# Patient Record
Sex: Female | Born: 1945 | Race: White | Hispanic: No | Marital: Married | Smoking: Former smoker
Health system: Southern US, Community
[De-identification: ages and names within clinical notes are randomized; demographics above are authoritative.]

## PROBLEM LIST (undated history)

## (undated) ENCOUNTER — Emergency Department (HOSPITAL_COMMUNITY): Admission: EM | Payer: Medicare HMO | Source: Home / Self Care

## (undated) DIAGNOSIS — E785 Hyperlipidemia, unspecified: Secondary | ICD-10-CM

## (undated) DIAGNOSIS — Z8679 Personal history of other diseases of the circulatory system: Secondary | ICD-10-CM

## (undated) DIAGNOSIS — I10 Essential (primary) hypertension: Secondary | ICD-10-CM

## (undated) DIAGNOSIS — E079 Disorder of thyroid, unspecified: Secondary | ICD-10-CM

## (undated) DIAGNOSIS — H269 Unspecified cataract: Secondary | ICD-10-CM

## (undated) DIAGNOSIS — C4490 Unspecified malignant neoplasm of skin, unspecified: Secondary | ICD-10-CM

## (undated) HISTORY — DX: Unspecified cataract: H26.9

## (undated) HISTORY — PX: MELANOMA EXCISION: SHX5266

## (undated) HISTORY — DX: Unspecified malignant neoplasm of skin, unspecified: C44.90

## (undated) HISTORY — DX: Essential (primary) hypertension: I10

## (undated) HISTORY — PX: CATARACT EXTRACTION, BILATERAL: SHX1313

## (undated) HISTORY — PX: COLONOSCOPY: SHX174

## (undated) HISTORY — PX: DILATION AND CURETTAGE OF UTERUS: SHX78

## (undated) HISTORY — DX: Personal history of other diseases of the circulatory system: Z86.79

## (undated) HISTORY — DX: Disorder of thyroid, unspecified: E07.9

## (undated) HISTORY — DX: Hyperlipidemia, unspecified: E78.5

## (undated) HISTORY — PX: POLYPECTOMY: SHX149

## (undated) HISTORY — PX: ABDOMINAL HYSTERECTOMY: SHX81

## (undated) HISTORY — PX: FOOT MASS EXCISION: SHX1663

---

## 1998-11-07 ENCOUNTER — Other Ambulatory Visit: Admission: RE | Admit: 1998-11-07 | Discharge: 1998-11-07 | Payer: Self-pay | Admitting: Internal Medicine

## 1999-08-05 ENCOUNTER — Encounter: Admission: RE | Admit: 1999-08-05 | Discharge: 1999-08-05 | Payer: Self-pay | Admitting: Internal Medicine

## 1999-08-05 ENCOUNTER — Encounter: Payer: Self-pay | Admitting: Internal Medicine

## 1999-11-12 ENCOUNTER — Other Ambulatory Visit: Admission: RE | Admit: 1999-11-12 | Discharge: 1999-11-12 | Payer: Self-pay | Admitting: Internal Medicine

## 2000-08-05 ENCOUNTER — Encounter: Payer: Self-pay | Admitting: Internal Medicine

## 2000-08-05 ENCOUNTER — Encounter: Admission: RE | Admit: 2000-08-05 | Discharge: 2000-08-05 | Payer: Self-pay | Admitting: Internal Medicine

## 2000-11-11 ENCOUNTER — Other Ambulatory Visit: Admission: RE | Admit: 2000-11-11 | Discharge: 2000-11-11 | Payer: Self-pay | Admitting: Internal Medicine

## 2001-04-03 ENCOUNTER — Encounter (INDEPENDENT_AMBULATORY_CARE_PROVIDER_SITE_OTHER): Payer: Self-pay | Admitting: *Deleted

## 2001-04-03 ENCOUNTER — Encounter (INDEPENDENT_AMBULATORY_CARE_PROVIDER_SITE_OTHER): Payer: Self-pay | Admitting: Specialist

## 2001-04-03 ENCOUNTER — Ambulatory Visit (HOSPITAL_COMMUNITY): Admission: RE | Admit: 2001-04-03 | Discharge: 2001-04-03 | Payer: Self-pay | Admitting: *Deleted

## 2001-08-07 ENCOUNTER — Encounter: Payer: Self-pay | Admitting: Internal Medicine

## 2001-08-07 ENCOUNTER — Encounter: Admission: RE | Admit: 2001-08-07 | Discharge: 2001-08-07 | Payer: Self-pay | Admitting: Internal Medicine

## 2001-12-13 ENCOUNTER — Encounter: Admission: RE | Admit: 2001-12-13 | Discharge: 2001-12-13 | Payer: Self-pay | Admitting: Internal Medicine

## 2001-12-13 ENCOUNTER — Encounter: Payer: Self-pay | Admitting: Internal Medicine

## 2002-08-13 ENCOUNTER — Encounter: Admission: RE | Admit: 2002-08-13 | Discharge: 2002-08-13 | Payer: Self-pay | Admitting: Internal Medicine

## 2002-08-13 ENCOUNTER — Encounter: Payer: Self-pay | Admitting: Internal Medicine

## 2002-12-10 ENCOUNTER — Other Ambulatory Visit: Admission: RE | Admit: 2002-12-10 | Discharge: 2002-12-10 | Payer: Self-pay | Admitting: Internal Medicine

## 2003-08-28 ENCOUNTER — Encounter: Admission: RE | Admit: 2003-08-28 | Discharge: 2003-08-28 | Payer: Self-pay | Admitting: Internal Medicine

## 2003-12-10 ENCOUNTER — Other Ambulatory Visit: Admission: RE | Admit: 2003-12-10 | Discharge: 2003-12-10 | Payer: Self-pay | Admitting: Internal Medicine

## 2004-07-03 ENCOUNTER — Ambulatory Visit (HOSPITAL_COMMUNITY): Admission: RE | Admit: 2004-07-03 | Discharge: 2004-07-03 | Payer: Self-pay | Admitting: *Deleted

## 2004-07-03 ENCOUNTER — Encounter (INDEPENDENT_AMBULATORY_CARE_PROVIDER_SITE_OTHER): Payer: Self-pay | Admitting: *Deleted

## 2004-09-04 ENCOUNTER — Encounter: Admission: RE | Admit: 2004-09-04 | Discharge: 2004-09-04 | Payer: Self-pay | Admitting: Internal Medicine

## 2004-09-18 ENCOUNTER — Observation Stay (HOSPITAL_COMMUNITY): Admission: EM | Admit: 2004-09-18 | Discharge: 2004-09-19 | Payer: Self-pay | Admitting: Emergency Medicine

## 2005-09-08 ENCOUNTER — Encounter: Admission: RE | Admit: 2005-09-08 | Discharge: 2005-09-08 | Payer: Self-pay | Admitting: Internal Medicine

## 2006-03-11 ENCOUNTER — Encounter (INDEPENDENT_AMBULATORY_CARE_PROVIDER_SITE_OTHER): Payer: Self-pay | Admitting: *Deleted

## 2006-03-11 ENCOUNTER — Encounter: Admission: RE | Admit: 2006-03-11 | Discharge: 2006-03-11 | Payer: Self-pay | Admitting: Internal Medicine

## 2006-09-21 ENCOUNTER — Encounter: Admission: RE | Admit: 2006-09-21 | Discharge: 2006-09-21 | Payer: Self-pay | Admitting: Internal Medicine

## 2007-05-29 ENCOUNTER — Other Ambulatory Visit: Admission: RE | Admit: 2007-05-29 | Discharge: 2007-05-29 | Payer: Self-pay | Admitting: Unknown Physician Specialty

## 2007-09-27 ENCOUNTER — Encounter: Admission: RE | Admit: 2007-09-27 | Discharge: 2007-09-27 | Payer: Self-pay | Admitting: Internal Medicine

## 2007-10-06 ENCOUNTER — Encounter: Admission: RE | Admit: 2007-10-06 | Discharge: 2007-10-06 | Payer: Self-pay | Admitting: Internal Medicine

## 2008-05-31 ENCOUNTER — Other Ambulatory Visit: Admission: RE | Admit: 2008-05-31 | Discharge: 2008-05-31 | Payer: Self-pay | Admitting: Internal Medicine

## 2008-10-23 ENCOUNTER — Encounter: Admission: RE | Admit: 2008-10-23 | Discharge: 2008-10-23 | Payer: Self-pay | Admitting: Internal Medicine

## 2009-01-12 ENCOUNTER — Encounter: Admission: RE | Admit: 2009-01-12 | Discharge: 2009-01-12 | Payer: Self-pay | Admitting: Internal Medicine

## 2009-11-21 ENCOUNTER — Encounter: Admission: RE | Admit: 2009-11-21 | Discharge: 2009-11-21 | Payer: Self-pay | Admitting: Internal Medicine

## 2010-06-03 ENCOUNTER — Encounter (INDEPENDENT_AMBULATORY_CARE_PROVIDER_SITE_OTHER): Payer: Self-pay | Admitting: *Deleted

## 2010-06-03 ENCOUNTER — Encounter: Payer: Self-pay | Admitting: Gastroenterology

## 2010-06-08 ENCOUNTER — Encounter: Payer: Self-pay | Admitting: Gastroenterology

## 2010-08-10 ENCOUNTER — Encounter: Payer: Self-pay | Admitting: Gastroenterology

## 2010-08-10 ENCOUNTER — Ambulatory Visit
Admission: RE | Admit: 2010-08-10 | Discharge: 2010-08-10 | Payer: Self-pay | Source: Home / Self Care | Attending: Gastroenterology | Admitting: Gastroenterology

## 2010-08-10 DIAGNOSIS — Z860100 Personal history of colon polyps, unspecified: Secondary | ICD-10-CM | POA: Insufficient documentation

## 2010-08-16 ENCOUNTER — Encounter: Payer: Self-pay | Admitting: Internal Medicine

## 2010-08-25 NOTE — Letter (Signed)
Summary: New Patient letter  [facility]  [address] [place], [place] [address]   Phone: [phone]  Fax: [phone]       06/03/2010 MRN: [id number]    [name] [address] [place], [place]  [address]  Dear Ms. Tisdel,  Welcome to the Gastroenterology Division at [facility].    You are scheduled to see Dr.  Russella Dar  on August 10, 2010 at 10:00 A.M. on the 3rd floor at [facility], [address]  We ask that you try to arrive at our office 15 minutes prior to your appointment time to allow for check-in.  We would like you to complete the enclosed self-administered evaluation form prior to your visit and bring it with you on the day of your appointment.  We will review it with you.  Also, please bring a complete list of all your medications or, if you prefer, bring the medication bottles and we will list them.  Please bring your insurance card so that we may make a copy of it.  If your insurance requires a referral to see a specialist, please bring your referral form from your primary care physician.  Co-payments are due at the time of your visit and may be paid by cash, check or credit card.     Your office visit will consist of a consult with your physician (includes a physical exam), any laboratory testing he/she may order, scheduling of any necessary diagnostic testing (e.g. x-ray, ultrasound, CT-scan), and scheduling of a procedure (e.g. Endoscopy, Colonoscopy) if required.  Please allow enough time on your schedule to allow for any/all of these possibilities.    If you cannot keep your appointment, please call [phone] to cancel or reschedule prior to your appointment date.  This allows [place] the opportunity to schedule an appointment for another patient in need of care.  If you do not cancel or reschedule by 5 p.m. the business day prior to your appointment date, you will be charged a $50.00 late cancellation/no-show fee.    Thank you for  choosing [facility] for your medical needs.  We appreciate the opportunity to care for you.  Please visit [place] at our website  to learn more about our practice.                     Sincerely,                                                             The Gastroenterology Division

## 2010-08-27 NOTE — Letter (Signed)
Summary: [facility] Instructions  [facility]  [address] [place], [place] [address]   Phone: [phone]  Fax: [phone]       [name]    1946-05-30    MRN: [id number]        Procedure Day /Date: Monday February 6th, 2012     Arrival Time: 1:30pm     Procedure Time: 2:30pm     Location of Procedure:                    _ x_  [facility] (4th Floor)                        PREPARATION FOR COLONOSCOPY WITH MOVIPREP   Starting 5 days prior to your procedure 08/26/10 do not eat nuts, seeds, popcorn, corn, beans, peas,  salads, or any raw vegetables.  Do not take any fiber supplements (e.g. Metamucil, Citrucel, and Benefiber).  THE DAY BEFORE YOUR PROCEDURE         DATE: 08/30/10  DAY: Sunday  1.  Drink clear liquids the entire day-NO SOLID FOOD  2.  Do not drink anything colored red or purple.  Avoid juices with pulp.  No orange juice.  3.  Drink at least 64 oz. (8 glasses) of fluid/clear liquids during the day to prevent dehydration and help the prep work efficiently.  CLEAR LIQUIDS INCLUDE: Water Jello Ice Popsicles Tea (sugar ok, no milk/cream) Powdered fruit flavored drinks Coffee (sugar ok, no milk/cream) Gatorade Juice: apple, white grape, white cranberry  Lemonade Clear bullion, consomm, broth Carbonated beverages (any kind) Strained chicken noodle soup Hard Candy                             4.  In the morning, mix first dose of MoviPrep solution:    Empty 1 Pouch A and 1 Pouch B into the disposable container    Add lukewarm drinking water to the top line of the container. Mix to dissolve    Refrigerate (mixed solution should be used within 24 hrs)  5.  Begin drinking the prep at 5:00 p.m. The MoviPrep container is divided by 4 marks.   Every 15 minutes drink the solution down to the next mark (approximately 8 oz) until the full liter is complete.   6.  Follow completed prep with 16 oz of clear liquid of your choice  (Nothing red or purple).  Continue to drink clear liquids until bedtime.  7.  Before going to bed, mix second dose of MoviPrep solution:    Empty 1 Pouch A and 1 Pouch B into the disposable container    Add lukewarm drinking water to the top line of the container. Mix to dissolve    Refrigerate  THE DAY OF YOUR PROCEDURE      DATE: 08/31/10 DAY: Monday  Beginning at 9:30 a.m. (5 hours before procedure):         1. Every 15 minutes, drink the solution down to the next mark (approx 8 oz) until the full liter is complete.  2. Follow completed prep with 16 oz. of clear liquid of your choice.    3. You may drink clear liquids until 12:30pm (2 HOURS BEFORE PROCEDURE).   MEDICATION INSTRUCTIONS  Unless otherwise instructed, you should take regular prescription medications with a small sip of water   as early as possible the morning of your  procedure.         OTHER INSTRUCTIONS  You will need a responsible adult at least 67 years of age to accompany you and drive you home.   This person must remain in the waiting room during your procedure.  Wear loose fitting clothing that is easily removed.  Leave jewelry and other valuables at home.  However, you may wish to bring a book to read or  an iPod/MP3 player to listen to music as you wait for your procedure to start.  Remove all body piercing jewelry and leave at home.  Total time from sign-in until discharge is approximately 2-3 hours.  You should go home directly after your procedure and rest.  You can resume normal activities the  day after your procedure.  The day of your procedure you should not:   Drive   Make legal decisions   Operate machinery   Drink alcohol   Return to work  You will receive specific instructions about eating, activities and medications before you leave.    The above instructions have been reviewed and explained to me by   Marchelle Folks.    I fully understand and can verbalize these  instructions _____________________________ Date _________

## 2010-08-27 NOTE — Letter (Signed)
Summary: [facility]   Imported By: Sherian Rein 08/13/2010 10:16:47  _____________________________________________________________________  External Attachment:    Type:   Image     Comment:   External Document

## 2010-08-27 NOTE — Op Note (Signed)
Summary: Colonoscopy  NAME:  [name], [name]                 ACCOUNT NO.:  [id number]   MEDICAL RECORD NO.:  [id number]          PATIENT TYPE:  AMB   LOCATION:  ENDO                         FACILITY:  [facility]   PHYSICIAN:  Georgiana Spinner, M.D.    DATE OF BIRTH:  [birth year]   DATE OF PROCEDURE:  07/03/2004  DATE OF DISCHARGE:                                 OPERATIVE REPORT   PROCEDURE:  Colonoscopy.   INDICATIONS:  Follow up colon polyps.   ANESTHESIA:  1.  Demerol 70 mg.  2.  Versed 7 mg.   DESCRIPTION OF PROCEDURE:  With patient mildly sedated in the left lateral  decubitus position, the Olympus videoscopic colonoscope was inserted in the  rectum and passed under direct vision to the cecum, identified by the  ileocecal valve and appendiceal orifice, both of which were photographed.  From this point, the colonoscope was slowly withdrawn, taking  circumferential views of the colonic mucosa, stopping only in the rectum  which appeared normal on direct and showed hemorrhoids on retroflexed view.  The scope was straightened and withdrawn.  The patient's vital signs and  pulse oximeter remained stable.  The patient tolerated the procedure well  without apparent complications.   FINDINGS:  Internal hemorrhoids, otherwise unremarkable exam.   PLAN:  Repeat examination in 5 years.      GMO/MEDQ  D:  07/03/2004  T:  07/03/2004  Job:  [id number]

## 2010-08-27 NOTE — Op Note (Signed)
Summary: Colonoscopy and biopsy                         [facility]  Patient:    [name], [name] Visit Number: [id number] MRN: [id number]          Service Type: END Location: ENDO Attending Physician:  Sabino Gasser Dictated by:   Sabino Gasser, M.D. Admit Date:  04/03/2001                             Procedure Report  PROCEDURE:  Colonoscopy.  ENDOSCOPIST:  Sabino Gasser, M.D.  INDICATIONS:  Rectal bleeding.  ANESTHESIA:  Demerol 80 mg, Versed 8 mg.  DESCRIPTION OF PROCEDURE:  With patient mildly sedated in the left lateral decubitus position, the Olympus videoscopic colonoscope was inserted in the rectum and passed under direct vision to the cecum, identified by ileocecal valve and appendiceal orifice, both of which were photographed.  From this point, the colonoscope was slowly withdrawn, taking circumferential views of the entire colonic mucosa, stopping only in the rectum, which appeared normal and showed internal hemorrhoids on retroflexed view.  The only stop we made was at approximately 20 cm from the anal verge, at which point a very large polyp was seen on a thick stalk.  This was photographed and subsequently using snare cautery technique, setting of 20/20 blended current, we snared the polyp at its stalk and resected it.  There was good hemostasis.  No blood was passed at all.  This area was photographed and the tissue was retrieved using a International aid/development worker.  Patients vital signs and pulse oximetry remained stable. Patient tolerated procedure well without apparent complications.  FINDINGS:  Internal hemorrhoids; large polyp at 20 cm, removed; await biopsy report.  PLAN:  Patient will call me for results and follow up with me as an outpatient. Dictated by:   Sabino Gasser, M.D. Attending Physician:  Sabino Gasser DD:  04/03/01 TD:  04/03/01 Job: [id number]       SP Surgical Pathology - STATUS: Final             By: Morrie Sheldon,        Perform Date: 72 Sep02 09:56  Ordered By: Jarvis Newcomer,            Ordered Date:  Facility: [facility]                              Department: CPATH  Service Report Text  [facility]   [address] [place], [place] [address]   [phone]    REPORT OF SURGICAL PATHOLOGY    Case #: [id number]   Patient Name: [name], [name]   PID: [id number]   Pathologist: Beulah Gandy. 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 STALK MARGIN NOT   INVOLVED BY ADENOMA. NO HIGH GRADE DYSPLASIA OR MALIGNANCY   IDENTIFIED.    cf   Date Reported: 04/04/2001 Beulah Gandy. Luisa Hart, MD   *** Electronically Signed Out By JDP ***    specimen(s) obtained   Colon, polyp @ 20 cm    Gross Description  Received in formalin is a 1.3 x 1.2 x 1.2 cm rubbery red   pedunculated mucosal polyp with a 0.4 cm in length x 0.8 cm in   diameter stalk. The base of the stalk is inked black. The   specimen is sectioned perpendicular to the base of the stalk and   entirely submitted as three parts in two blocks labeled A and B.   (SHP:caf 04/03/01)    cf/

## 2010-08-27 NOTE — Assessment & Plan Note (Signed)
Summary: Consult colonoscopy   History of Present Illness Visit Type: Initial Consult Primary GI MD: Elie Goody MD [facility] Primary Provider: Pearson Grippe, MD Requesting Provider: Pearson Grippe, MD Chief Complaint: Colon screening, patient not having any problems History of Present Illness:   This is a 65 year old female previously followed by Dr. Sabino Gasser, who has a history of a tubulovillous adenomatous polyp, removed in 2002. Follow up colonoscopy in 2005 was negative. She has no ongoing gastrointestinal complaints. She has stable hypertension, hyperlipidemia, and cardiomegaly.   GI Review of Systems      Denies abdominal pain, acid reflux, belching, bloating, chest pain, dysphagia with liquids, dysphagia with solids, heartburn, loss of appetite, nausea, vomiting, vomiting blood, weight loss, and  weight gain.        Denies anal fissure, black tarry stools, change in bowel habit, constipation, diarrhea, diverticulosis, fecal incontinence, heme positive stool, hemorrhoids, irritable bowel syndrome, jaundice, light color stool, liver problems, rectal bleeding, and  rectal pain.   Current Medications (verified): 1)  Multivitamins   Tabs (Multiple Vitamin) .... One Tablet By Mouth Once Daily 2)  Quinapril-Hydrochlorothiazide 10-12.5 Mg Tabs (Quinapril-Hydrochlorothiazide) .... Take 1 Tablet By Mouth Once Daily 3)  Lipitor 40 Mg Tabs (Atorvastatin Calcium) .... 1/2 Tablet By Mouth At Bedtime 4)  Tums 500 Mg Chew (Calcium Carbonate Antacid) .... 3 Tablets By Mouth Once Daily 5)  Aspirin 81 Mg  Tabs (Aspirin) .... One Tablet By Mouth Once Daily 6)  Fish Oil 1000 Mg Caps (Omega-3 Fatty Acids) .... Once Daily 7)  Synthroid 100 Mcg Tabs (Levothyroxine Sodium) .... Take 1 Tablet Once Daily For 6 Days, Then 1/2 Tablet On 7th Day 8)  Vitamin D 1000iu .... Once Daily  Allergies (verified): 1)  ! Chloromycetin  Past History:  Past Medical History: Reviewed history from 08/06/2010 and no changes  required. 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 Appendectomy Hysterectomy Resection and immunotherapy at [facility] 1970's T & A Pilonidal cyst removal Basal cell 12/2009  Family History: Reviewed history from 08/05/2010 and no changes required. Family History of Heart Disease: Father CVA: Mother  Social History: Married Retired Charity fundraiser Patient is a former smoker.  Illicit Drug Use - no Alcohol Use - yes occasional glass of wine every couple of months Daily Caffeine Use  Review of Systems       The patient complains of back pain and heart murmur.         The pertinent positives and negatives are noted as above and in the HPI. All other ROS were reviewed and were negative.   Vital Signs:  Patient profile:   65 year old female Height:      66 inches Weight:      206.25 pounds BMI:     33.41 Pulse rate:   80 / minute Pulse rhythm:   regular BP sitting:   120 / 74  (right arm) Cuff size:   regular  Vitals Entered By: June McMurray CMA Duncan Dull) (August 10, 2010 10:03 AM)  Physical Exam  General:  Well developed, well nourished, no acute distress. Head:  Normocephalic and atraumatic. Eyes:  PERRLA, no icterus. Ears:  Normal auditory acuity. Mouth:  No deformity or lesions, dentition normal. Neck:  Supple; no masses or thyromegaly. Lungs:  Clear throughout to auscultation. Heart:  Regular rate and rhythm; no murmurs, rubs,  or bruits. Abdomen:  Soft, nontender and nondistended. No masses, hepatosplenomegaly or hernias noted. Normal bowel sounds. Msk:  Symmetrical with no gross deformities. Normal posture. Pulses:  Normal pulses noted. Extremities:   No clubbing, cyanosis, edema or deformities noted. Neurologic:  Alert and  oriented x4;  grossly normal neurologically. Cervical Nodes:  No significant cervical adenopathy. Inguinal Nodes:  No significant inguinal adenopathy. Psych:  Alert and cooperative. Normal mood and affect.  Impression & Recommendations:  Problem # 1:  PERSONAL HX COLONIC POLYPS (ICD-V12.72) Tubulovillous adenomatous colon polyp, removed in 2002. Last surveillance colonoscopy 2005. She is due for surveillance colonoscopy. The risks, benefits and alternatives to colonoscopy with possible biopsy and possible polypectomy were discussed with the patient and they consent to proceed. The procedure will be scheduled electively.  Other Orders: Colonoscopy (Colon)  Patient Instructions: 1)  Pick up your prep from your pharmacy.  2)  Colonoscopy brochure given.  3)  Copy sent to : Pearson Grippe, MD 4)  The medication list was reviewed and reconciled.  All changed / newly prescribed medications were explained.  A complete medication list was provided to the patient / caregiver.  Prescriptions: MOVIPREP 100 GM  SOLR (PEG-KCL-NACL-NASULF-NA ASC-C) As per prep instructions.  #1 x 0   Entered by:   Christie Nottingham CMA (AAMA)   Authorized by:   Meryl Dare MD [facility]   Signed by:   Christie Nottingham CMA (AAMA) on 08/10/2010   Method used:   Electronically to        [facility]. [address] [phone]* (retail)       [address] [place], [place]  [id number]       Ph: [phone]       Fax: [phone]   RxID:   [id number]

## 2010-08-31 ENCOUNTER — Other Ambulatory Visit (AMBULATORY_SURGERY_CENTER): Payer: Medicare Other | Admitting: Gastroenterology

## 2010-08-31 ENCOUNTER — Encounter: Payer: Self-pay | Admitting: Gastroenterology

## 2010-08-31 DIAGNOSIS — K5730 Diverticulosis of large intestine without perforation or abscess without bleeding: Secondary | ICD-10-CM

## 2010-08-31 DIAGNOSIS — Z1211 Encounter for screening for malignant neoplasm of colon: Secondary | ICD-10-CM

## 2010-08-31 DIAGNOSIS — Z86010 Personal history of colonic polyps: Secondary | ICD-10-CM

## 2010-09-10 NOTE — Procedures (Signed)
Summary: Colonoscopy   Colonoscopy  Procedure date:  08/31/2010  Findings:      Location:  [facility].    Procedures Next Due Date:    Colonoscopy: 08/2015 COLONOSCOPY PROCEDURE REPORT PATIENT:  [name], [name]  MR#:  [id number] BIRTHDATE:   13-Aug-1945, 64 yrs. old   GENDER:   female ENDOSCOPIST:   Judie Petit T. Russella Dar, MD, [facility] Referred by: Pearson Grippe, M.D. PROCEDURE DATE:  08/31/2010 PROCEDURE:  Higher-risk screening colonoscopy G0105 ASA CLASS:   Class II INDICATIONS: 1) surveillance and high-risk screening  2) history of pre-cancerous (tubulovillous adenomatous) colon polyps: 2002. MEDICATIONS:    Fentanyl 75 mcg IV, Versed 8 mg IV DESCRIPTION OF PROCEDURE:   After the risks benefits and alternatives of the procedure were thoroughly explained, informed consent was obtained.  Digital rectal exam was performed and revealed no abnormalities.   The LB [id number]-[id number] endoscope was introduced through the anus and advanced to the cecum, which was identified by both the appendix and ileocecal valve, without limitations.  The quality of the prep was excellent, using MoviPrep.  The instrument was then slowly withdrawn as the colon was fully examined. <<PROCEDUREIMAGES>>          <<OLD IMAGES>> FINDINGS:  Mild diverticulosis was found in the sigmoid colon. A normal appearing cecum, ileocecal valve, and appendiceal orifice were identified. The ascending, hepatic flexure, transverse, splenic flexure, descending colon, and rectum appeared unremarkable. Retroflexed views in the rectum revealed no abnormalities.  The time to cecum =  2.67  minutes. The scope was then withdrawn (time =  10.5  min) from the patient and the procedure completed. COMPLICATIONS:   None  ENDOSCOPIC IMPRESSION:  1) Mild diverticulosis in the sigmoid colon   RECOMMENDATIONS:  1) Repeat Colonoscopy in 5 years.  2) High fiber diet with liberal fluid intake.   Venita Lick. Russella Dar, MD, [facility]

## 2010-11-16 ENCOUNTER — Other Ambulatory Visit: Payer: Self-pay | Admitting: Internal Medicine

## 2010-11-16 DIAGNOSIS — Z1231 Encounter for screening mammogram for malignant neoplasm of breast: Secondary | ICD-10-CM

## 2010-11-27 ENCOUNTER — Ambulatory Visit
Admission: RE | Admit: 2010-11-27 | Discharge: 2010-11-27 | Disposition: A | Discharge status: Home / Self Care | Payer: Medicare Other | Source: Ambulatory Visit | Attending: Internal Medicine | Admitting: Internal Medicine

## 2010-11-27 DIAGNOSIS — Z1231 Encounter for screening mammogram for malignant neoplasm of breast: Secondary | ICD-10-CM

## 2010-12-11 NOTE — Consult Note (Signed)
[name], Fuller                 ACCOUNT NO.:  [id number]   MEDICAL RECORD NO.:  [id number]          PATIENT TYPE:  INP   LOCATION:  1843                         FACILITY:  [facility]   PHYSICIAN:  Jaclyn Prime. Lucas Mallow, M.D.   DATE OF BIRTH:  Nov 30, 1945   DATE OF CONSULTATION:  DATE OF DISCHARGE:                                   CONSULTATION   CHIEF COMPLAINT:  Atrial fibrillation.   I appreciate the opportunity to participate in the care of this 65 year old,  [name], date of birth 03-13-1946, a primary care  patient of Dr. Dimas Alexandria, currently cared for in the hospital by Drs.  Hannah Beat and Mamie Levers, by whom this consult was requested.  I am  providing the consult in regard to her acute onset of atrial fibrillation.   [name] says that she got up to go to the bathroom at about 12:30 this  morning and felt a flutter in her chest.  This was associated with mild  shortness of breath, and she had several episodes of diarrhea during the  night, as well as frequent urination.  This morning, since matters were not  improving, she came to the emergency room.  She was found to have atrial  fibrillation and is being evaluated and treated for that.   PAST MEDICAL HISTORY:  Remarkable for a hysterectomy in 1989 and an  appendectomy, as well.  About 1990, she had radioactive iodine ablation for  hyperthyroidism.  About 1998, she stopped smoking.  Dr. Lendell Caprice has treated  her for hypertension and hyperlipidemia, in addition.   CURRENT MEDICATIONS:  1.  Synthroid 0.125 mg daily.  2.  Lipitor 20 mg daily.  3.  Accuretic 12.5/10 daily.   FAMILY HISTORY:  Her mother died at about 87 of a stroke.  Her father died  at 44 of a heart attack.  A brother is in good health.  Both of her parents  were heavy smokers.   SOCIAL HISTORY:  She has been married for 39 years.  She has no children.  They have raised horses and dogs and other pets over the years.  She is a  retired Charity fundraiser, __________ in about 1985.  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   REVIEW OF SYSTEMS:  CONSTITUTIONAL:  She denies fevers, chills, or sweats.  She has had no claudication.  She has had some recent weight gain.  EYES:  No diplopia or blurring.  She wears glasses.  ENT:  No deafness or  dizziness.  CARDIOVASCULAR:  See history of present illness.  She has not  had any symptom suggestive of atrial fibrillation, nor chest pain,  presyncope.  RESPIRATORY:  No cough or wheezing.  Her smoking history as  noted above.  GI:  No dysphagia, nausea, vomiting, and no heartburn; several  episodes of diarrhea this morning noted.  This was not a problem prior to  the onset of atrial fibrillation.  GU:  No dysuria or pyuria.  MUSCULOSKELETAL:  No swollen joint or myalgias.  SKIN  and BREAST:  No rash  or nodule.  NEUROLOGIC:  No faintness or syncope.  PSYCHIATRIC:  No  depression or hallucination.  ENDOCRINE:  No known diabetes.  Her  hyperthyroidism, ablative therapy, and replacement therapy are noted above.  HEME and LYMPH:  No swelling in the neck, axillary, or groin.  ALLERGIC and  LYMPHATIC:  She is allergic to CHLOROMYCETIN, an old antibiotic of currently  unknown class.  All of the remaining systems in her comprehensive system  review are negative.   PHYSICAL EXAMINATION:  VITAL SIGNS:  Blood pressure 122/62, pulse now 100  and irregularly irregular, but considerably faster earlier, respirations 18  and unlabored.  GENERAL:  She is a well-developed, well-nourished woman in no acute  distress.  She is oriented to person, place, and time.  Her mood and affect  are pleasant.  HEENT:  Her conjunctivae and lids reveal no xanthelasma, icterus, or arcus  senilis.  She has most of her own teeth with many crowns and in good repair.  The oral mucosa reveals no pallor or cyanosis.  NECK:  Supple and symmetrical.  The trachea is midline and mobile.  There is  no palpable  thyromegaly or cervical node.  No carotid bruit or JVD.  LUNGS:  Her respiratory effort is normal.  Her lungs are clear to  auscultation and percussion.  BACK:  Straight and nontender.  NEUROLOGIC:  Her gait is not tested.  Presumably, she could undergo a stress  test if appropriate.  Muscle strength and tone are age appropriate.  CARDIAC:  Apical impulse is cryptic.  The left border of cardiac dullness is  within the left anterior axillary line.  There is a faint precordial impulse  along the left sternal border consistent with atrial fibrillation.  There is  no gallop, click, or murmur.  Her digits reveal no clubbing or cyanosis.  SKIN:  Skin and subcutaneous tissue were without stasis dermatitis or ulcer.  ABDOMEN:  Flat and nontender.  There is no palpable enlargement over her  spleen.  The abdominal aorta is without bruit.  EXTREMITIES:  The femoral arteries are without bruits.  The posterior tibial  pulses are palpable. The dorsalis pedis pulses are not.  Her legs reveal no  edema or varicosity.   LABORATORY DATA:  Her EKG shows atrial fibrillation with a rapid rate and no  other specific abnormality.   IMPRESSION:  [name] has atrial fibrillation of acute onset within the  last 12 hours.  She is currently on a Diltiazem drip and her heart rate has  dropped into the 90s.  There is a good chance that she could be cardioverted  within the next 24 hours although the timing for the weekend makes it  difficult.  I have added 400 mg of amiodarone to her current regimen to be  given now.  She is hypokalemic and will be getting additional potassium.  If  she fails to cardiovert by late this afternoon, it may be possible to  arrange a TEE-guided cardioversion.  I will try to make arrangements for  that.  If that is not feasible, then she will need to remain in the hospital  until she is fully started on Coumadin and can be discharged on either amiodarone or a combination of amiodarone  and a second drug, probably  Diltiazem.   I appreciate the opportunity to participate in her care.  I will let the  covering service for the weekend know that she is here.  DDG/MEDQ  D:  09/18/2004  T:  09/18/2004  Job:  [id number]   cc:   Janae Bridgeman. Eloise Harman., M.D.  [address]  [place]  [place] [address]  Fax: [phone]   Hettie Holstein, D.O.

## 2010-12-11 NOTE — Op Note (Signed)
NAMEGENEVRA, ORNE                 ACCOUNT NO.:  [id number]   MEDICAL RECORD NO.:  [id number]          PATIENT TYPE:  INP   LOCATION:  3743                         FACILITY:  [facility]   PHYSICIAN:  Cristy Hilts. Jacinto Halim, MD       DATE OF BIRTH:  1945-08-12   DATE OF PROCEDURE:  09/18/2004  DATE OF DISCHARGE:                                 OPERATIVE REPORT   REFERRING PHYSICIAN:  Jaclyn Prime. Lucas Mallow, M.D.   PROCEDURE PERFORMED:  Direct current cardioversion.   INDICATION:  [name] is a 65 year old female with a history of  hypertension and hyperlipidemia, who was doing well until this morning after  breakfast, suddenly felt palpitation.  She was found to be in atrial  fibrillation with rapid ventricular response.  Given the fact that it was  new onset atrial fibrillation, not associated with any chest pain or  shortness of breath; EKG otherwise was unremarkable; physical examination  was otherwise remarkable; given this, she was referred for direct current  cardioversion.   TECHNIQUE:  Using biphasic defibrillator, 75 joules of direct current was  delivered in a synchronized fashion with the conversion of atrial  fibrillation to normal sinus rhythm.  The patient tolerated the procedure  well. This was done under deep anesthesia with the help of 250 mg of  Pentothal.  No immediate complications were noted.      JRG/MEDQ  D:  09/18/2004  T:  09/19/2004  Job:  [id number]   cc:   Jaclyn Prime. Lucas Mallow, M.D.  [address]  [place]  [place] [address]  Fax: [phone]

## 2010-12-11 NOTE — Op Note (Signed)
[name], [name]                 ACCOUNT NO.:  [id number]   MEDICAL RECORD NO.:  [id number]          PATIENT TYPE:  AMB   LOCATION:  ENDO                         FACILITY:  [facility]   PHYSICIAN:  Georgiana Spinner, M.D.    DATE OF BIRTH:  [birth year]   DATE OF PROCEDURE:  07/03/2004  DATE OF DISCHARGE:                                 OPERATIVE REPORT   PROCEDURE:  Colonoscopy.   INDICATIONS:  Follow up colon polyps.   ANESTHESIA:  1.  Demerol 70 mg.  2.  Versed 7 mg.   DESCRIPTION OF PROCEDURE:  With patient mildly sedated in the left lateral  decubitus position, the Olympus videoscopic colonoscope was inserted in the  rectum and passed under direct vision to the cecum, identified by the  ileocecal valve and appendiceal orifice, both of which were photographed.  From this point, the colonoscope was slowly withdrawn, taking  circumferential views of the colonic mucosa, stopping only in the rectum  which appeared normal on direct and showed hemorrhoids on retroflexed view.  The scope was straightened and withdrawn.  The patient's vital signs and  pulse oximeter remained stable.  The patient tolerated the procedure well  without apparent complications.   FINDINGS:  Internal hemorrhoids, otherwise unremarkable exam.   PLAN:  Repeat examination in 5 years.      GMO/MEDQ  D:  07/03/2004  T:  07/03/2004  Job:  [id number]

## 2010-12-11 NOTE — H&P (Signed)
[name], [name]                 ACCOUNT NO.:  [id number]   MEDICAL RECORD NO.:  [id number]          PATIENT TYPE:  EMS   LOCATION:  MAJO                         FACILITY:  [facility]   PHYSICIAN:  [name], D.O.    DATE OF BIRTH:  1945/09/27   DATE OF ADMISSION:  09/18/2004  DATE OF DISCHARGE:                                HISTORY & PHYSICAL   PRIMARY CARE PHYSICIAN:  Dr. Dimas Fuller   CHIEF COMPLAINT:  Fluttering in the chest around 12:30 to 1 a.m. today.   HISTORY OF PRESENT ILLNESS:  This is a pleasant 65 year old female with good  functional status who got up to go to the bathroom this evening and noticed  as she returned to bed that her heart rate was up.  It was not regular.  She  has no prior history of this in the past.  She had some shortness of breath  and subsequently she did develop some diarrhea this morning afterwards.  Otherwise, she has had no other complaints.  No chest pain.  No nausea,  vomiting.  No cough or fevers, simply the fluttering in her chest which she  has not recognized in the past.   PAST MEDICAL HISTORY:  1.  Hypertension.  2.  Hypothyroidism.   PAST SURGICAL HISTORY:  1.  Hysterectomy in 1989.  She retains her ovaries.  2.  Appendectomy.   MEDICATIONS:  1.  No change in Synthroid for quite some time.  2.  Accuretic 12.5/10 daily.  3.  Multivitamins daily.  4.  Tums daily.   ALLERGIES:  __________.   SOCIAL HISTORY:  She quit smoking tobacco about 10 years ago.  She is  married.  She has no children.  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   FAMILY HISTORY:  Mother died at age 76 with cerebrovascular accident, father  at 95 with an MI.  Her sibling is okay.   REVIEW OF SYSTEMS:  Patient works some.  Mild weight gain due to decreased  exercise, increased appetite.  She has had a screening colonoscopy that she  reports was okay within the past five years.  She has not had a flu shot and  refuses  Pneumovax.  She has no vaginal discharge or dysuria.  No lower  extremity edema.   PHYSICAL EXAMINATION:  VITAL SIGNS:  Stable with blood pressure 115/80,  heart rate 120s-130s, irregular, respirations 16, O2 saturation 95% on room  air.  GENERAL:  This is a pleasant, younger-appearing than stated age Caucasian  female who presented with above complaints.  She is in no acute distress.  HEENT:  Head normocephalic, atraumatic.  Extraocular muscles are intact.  Pupils are equal and reactive to light.  Oral mucosa is pink and moist.  NECK:  Supple, nontender.  No palpable thyromegaly or mass.  CARDIOVASCULAR:  Irregular irregular rhythm, rate about 120-130.  LUNGS:  Clear to auscultation bilaterally.  She exhibits normal effort.  ABDOMEN:  Soft, nontender, no palpable hepatosplenomegaly or mass.  EXTREMITIES:  No edema.  Peripheral pulses are symmetrical,  palpable  bilaterally.  NEUROLOGIC:  She is euthymic.  Alert and oriented x3.  No focal deficits  noted.   LABORATORY DATA:  Sodium 138, potassium 3.3, BUN 7, creatinine 0.9, glucose  110.  LFTs within normal limits.  Mildly depressed albumin at 3.3.  INR 0.8.  Magnesium 2.1.  Point of care marker at 0030 is negative.  EKG:  This  reveals atrial fibrillation with rapid ventricular conduction.   ASSESSMENT:  1.  New onset atrial fibrillation, symptomatic with rapid ventricular rate.  2.  Hypertension.  3.  Diarrhea.  4.  Hypokalemia.  5.  Hypothyroidism.  6.  Hypercholesterolemia.   PLAN:  We are going to place [name] on low molecular weight heparin for  anticoagulation, control her rate.  Perhaps she will convert to sinus  rhythm.  If she does not convert to sinus rhythm she will likely need to  remain on Coumadin therapy to reduce her risk of cardioembolic stroke.  If  she does finally convert to sinus rhythm and symptoms are resolved it may be  reasonable to watch and wait, hold off on anticoagulation for now and  consider  close follow-up to assure that she remains in sinus rhythm.   We will check her TSH as well, will replete her potassium as these may be  contributing factors.      ESS/MEDQ  D:  09/18/2004  T:  09/18/2004  Job:  [id number]   cc:   [name]. [name]., M.D.  [address]  [place]  [place] [address]  Fax: [phone]

## 2010-12-11 NOTE — Procedures (Signed)
[facility]  Patient:    [name], [name] Visit Number: [id number] MRN: [id number]          Service Type: END Location: ENDO Attending Physician:  Sabino Gasser Dictated by:   Sabino Gasser, M.D. Admit Date:  04/03/2001                             Procedure Report  PROCEDURE:  Colonoscopy.  ENDOSCOPIST:  Sabino Gasser, M.D.  INDICATIONS:  Rectal bleeding.  ANESTHESIA:  Demerol 80 mg, Versed 8 mg.  DESCRIPTION OF PROCEDURE:  With patient mildly sedated in the left lateral decubitus position, the Olympus videoscopic colonoscope was inserted in the rectum and passed under direct vision to the cecum, identified by ileocecal valve and appendiceal orifice, both of which were photographed.  From this point, the colonoscope was slowly withdrawn, taking circumferential views of the entire colonic mucosa, stopping only in the rectum, which appeared normal and showed internal hemorrhoids on retroflexed view.  The only stop we made was at approximately 20 cm from the anal verge, at which point a very large polyp was seen on a thick stalk.  This was photographed and subsequently using snare cautery technique, setting of 20/20 blended current, we snared the polyp at its stalk and resected it.  There was good hemostasis.  No blood was passed at all.  This area was photographed and the tissue was retrieved using a International aid/development worker.  Patients vital signs and pulse oximetry remained stable. Patient tolerated procedure well without apparent complications.  FINDINGS:  Internal hemorrhoids; large polyp at 20 cm, removed; await biopsy report.  PLAN:  Patient will call me for results and follow up with me as an outpatient. Dictated by:   Sabino Gasser, M.D. Attending Physician:  Sabino Gasser DD:  04/03/01 TD:  04/03/01 Job: [id number]

## 2010-12-11 NOTE — Discharge Summary (Signed)
[name], [name]                 ACCOUNT NO.:  [id number]   MEDICAL RECORD NO.:  [id number]          PATIENT TYPE:  INP   LOCATION:  3743                         FACILITY:  [facility]   PHYSICIAN:  Jonna L. Robb Matar, M.D.DATE OF BIRTH:  Aug 31, 1945   DATE OF ADMISSION:  09/18/2004  DATE OF DISCHARGE:  09/19/2004                                 DISCHARGE SUMMARY   PRIMARY CARE PHYSICIAN:  Marcy Salvo C. Lendell Caprice, M.D.   CARDIOLOGIST:  Jaclyn Prime. Lucas Mallow, M.D.   FINAL DIAGNOSES:  1.  Atrial fibrillation.  2.  Hyperthyroidism secondary to elevated levels of Synthroid.  3.  Hypertension.  4.  Cardiomegaly.  5.  Hypercholesterolemia.   PROCEDURE:  Elective cardioversion.   ALLERGIES:  None.   CODE STATUS:  Full.   HISTORY:  This is a 65 year old white female who noticed tachycardia in the  evening, accompanied by shortness of breath, and fluttering in her chest,  and she states she has not this previously. She has had hypertension and  hypothyroidism, post RAI treatment, but had not had her thyroid level  checked in a while. She had noted a tremor as well.   PHYSICAL EXAMINATION:  Blood pressure 115/80, heart rate 130s and irregular.  No other physical findings.   EKG showed atrial fibrillation with rapid ventricular conduction.   HOSPITAL COURSE:  The patient was put on Lovenox for anticoagulation. She  was seen in consultation by Dr. Lucas Mallow and on September 18, 2004, at 3:30 the  patient was defibrillated and converted to normal sinus rhythm. T4 came back  at 13.4 and TSH of 0.03.   Chest x-ray showed some mild cardiomegaly.   DISPOSITION:  The patient will be discharged on Synthroid 100 mcg to be  started in 48 hours, Accuretic daily, Lipitor 20 mg daily, aspirin 325 mg  daily. She will be seeing Dr. Lucas Mallow in the office in three days, at which  point she will get an echocardiogram and Cardiolite stress test. I suggest  she get a repeat thyroid profile in the one month  and then get  another in six months following, unless it needs to be  readjusted in which case it can be done more often.   CONDITION ON DISCHARGE:  Satisfactory; 40 minutes were taken.      JLB/MEDQ  D:  09/19/2004  T:  09/19/2004  Job:  [id number]

## 2011-10-25 ENCOUNTER — Other Ambulatory Visit: Payer: Self-pay | Admitting: Internal Medicine

## 2011-10-25 DIAGNOSIS — Z1231 Encounter for screening mammogram for malignant neoplasm of breast: Secondary | ICD-10-CM

## 2011-12-01 ENCOUNTER — Ambulatory Visit
Admission: RE | Admit: 2011-12-01 | Discharge: 2011-12-01 | Disposition: A | Discharge status: Home / Self Care | Payer: Medicare Other | Source: Ambulatory Visit | Attending: Internal Medicine | Admitting: Internal Medicine

## 2011-12-01 DIAGNOSIS — Z1231 Encounter for screening mammogram for malignant neoplasm of breast: Secondary | ICD-10-CM

## 2012-06-14 ENCOUNTER — Other Ambulatory Visit (HOSPITAL_COMMUNITY)
Admission: RE | Admit: 2012-06-14 | Discharge: 2012-06-14 | Disposition: A | Discharge status: Home / Self Care | Payer: Medicare Other | Source: Ambulatory Visit | Attending: Internal Medicine | Admitting: Internal Medicine

## 2012-06-14 DIAGNOSIS — Z124 Encounter for screening for malignant neoplasm of cervix: Secondary | ICD-10-CM | POA: Insufficient documentation

## 2012-09-08 ENCOUNTER — Emergency Department: Payer: Self-pay | Admitting: Emergency Medicine

## 2012-10-31 ENCOUNTER — Other Ambulatory Visit: Payer: Self-pay

## 2012-10-31 DIAGNOSIS — Z1231 Encounter for screening mammogram for malignant neoplasm of breast: Secondary | ICD-10-CM

## 2012-12-11 ENCOUNTER — Ambulatory Visit
Admission: RE | Admit: 2012-12-11 | Discharge: 2012-12-11 | Disposition: A | Discharge status: Home / Self Care | Payer: Medicare Other | Source: Ambulatory Visit

## 2012-12-11 DIAGNOSIS — Z1231 Encounter for screening mammogram for malignant neoplasm of breast: Secondary | ICD-10-CM

## 2013-11-07 ENCOUNTER — Other Ambulatory Visit: Payer: Self-pay

## 2013-11-07 DIAGNOSIS — Z1231 Encounter for screening mammogram for malignant neoplasm of breast: Secondary | ICD-10-CM

## 2013-12-19 ENCOUNTER — Ambulatory Visit
Admission: RE | Admit: 2013-12-19 | Discharge: 2013-12-19 | Disposition: A | Discharge status: Home / Self Care | Payer: Medicare Other | Source: Ambulatory Visit

## 2013-12-19 DIAGNOSIS — Z1231 Encounter for screening mammogram for malignant neoplasm of breast: Secondary | ICD-10-CM

## 2014-11-19 ENCOUNTER — Other Ambulatory Visit: Payer: Self-pay

## 2014-11-19 DIAGNOSIS — Z1231 Encounter for screening mammogram for malignant neoplasm of breast: Secondary | ICD-10-CM

## 2014-12-24 ENCOUNTER — Ambulatory Visit
Admission: RE | Admit: 2014-12-24 | Discharge: 2014-12-24 | Disposition: A | Discharge status: Home / Self Care | Payer: Medicare Other | Source: Ambulatory Visit

## 2014-12-24 ENCOUNTER — Encounter (INDEPENDENT_AMBULATORY_CARE_PROVIDER_SITE_OTHER): Payer: Self-pay

## 2014-12-24 DIAGNOSIS — Z1231 Encounter for screening mammogram for malignant neoplasm of breast: Secondary | ICD-10-CM

## 2015-08-27 ENCOUNTER — Encounter: Payer: Self-pay | Admitting: Gastroenterology

## 2015-09-30 ENCOUNTER — Encounter: Payer: Self-pay | Admitting: Gastroenterology

## 2015-11-26 ENCOUNTER — Other Ambulatory Visit: Payer: Self-pay

## 2015-11-26 DIAGNOSIS — Z1231 Encounter for screening mammogram for malignant neoplasm of breast: Secondary | ICD-10-CM

## 2015-12-08 ENCOUNTER — Ambulatory Visit (AMBULATORY_SURGERY_CENTER): Payer: Self-pay

## 2015-12-08 ENCOUNTER — Encounter: Payer: Self-pay | Admitting: Gastroenterology

## 2015-12-08 VITALS — Ht 66.0 in | Wt 206.4 lb

## 2015-12-08 DIAGNOSIS — Z860100 Personal history of colon polyps, unspecified: Secondary | ICD-10-CM

## 2015-12-08 NOTE — Progress Notes (Signed)
No allergies to eggs or soy No past problems with anesthesia except post-op headache with general anesthesia x 1 No home oxygen No diet meds  Has email and internet; declined emmi

## 2015-12-15 ENCOUNTER — Encounter: Payer: Self-pay | Admitting: Gastroenterology

## 2015-12-15 ENCOUNTER — Ambulatory Visit (AMBULATORY_SURGERY_CENTER): Payer: Medicare Other | Admitting: Gastroenterology

## 2015-12-15 VITALS — BP 143/71 | HR 77 | Temp 98.6°F | Resp 14 | Ht 66.0 in | Wt 206.0 lb

## 2015-12-15 DIAGNOSIS — Z86010 Personal history of colonic polyps: Secondary | ICD-10-CM

## 2015-12-15 DIAGNOSIS — D125 Benign neoplasm of sigmoid colon: Secondary | ICD-10-CM

## 2015-12-15 DIAGNOSIS — K5140 Inflammatory polyps of colon without complications: Secondary | ICD-10-CM | POA: Diagnosis not present

## 2015-12-15 MED ORDER — SODIUM CHLORIDE 0.9 % IV SOLN: 500.0000 mL | INTRAVENOUS | Status: DC

## 2015-12-15 NOTE — Progress Notes (Signed)
Discharge instructions by Riki Sheer RN

## 2015-12-15 NOTE — Progress Notes (Signed)
Called to room to assist during endoscopic procedure.  Patient ID and intended procedure confirmed with present staff. Received instructions for my participation in the procedure from the performing physician.  

## 2015-12-15 NOTE — Patient Instructions (Signed)
YOU HAD AN ENDOSCOPIC PROCEDURE TODAY AT THE [facility]:   Refer to the procedure report that was given to you for any specific questions about what was found during the examination.  If the procedure report does not answer your questions, please call your gastroenterologist to clarify.  If you requested that your care partner not be given the details of your procedure findings, then the procedure report has been included in a sealed envelope for you to review at your convenience later.  YOU SHOULD EXPECT: Some feelings of bloating in the abdomen. Passage of more gas than usual.  Walking can help get rid of the air that was put into your GI tract during the procedure and reduce the bloating. If you had a lower endoscopy (such as a colonoscopy or flexible sigmoidoscopy) you may notice spotting of blood in your stool or on the toilet paper. If you underwent a bowel prep for your procedure, you may not have a normal bowel movement for a few days.  Please Note:  You might notice some irritation and congestion in your nose or some drainage.  This is from the oxygen used during your procedure.  There is no need for concern and it should clear up in a day or so.  SYMPTOMS TO REPORT IMMEDIATELY:   Following lower endoscopy (colonoscopy or flexible sigmoidoscopy):  Excessive amounts of blood in the stool  Significant tenderness or worsening of abdominal pains  Swelling of the abdomen that is new, acute  Fever of 100F or higher   For urgent or emergent issues, a gastroenterologist can be reached at any hour by calling [phone].   DIET: Your first meal following the procedure should be a small meal and then it is ok to progress to your normal diet. Heavy or fried foods are harder to digest and may make you feel nauseous or bloated.  Likewise, meals heavy in dairy and vegetables can increase bloating.  Drink plenty of fluids but you should avoid alcoholic beverages for 24  hours.  ACTIVITY:  You should plan to take it easy for the rest of today and you should NOT DRIVE or use heavy machinery until tomorrow (because of the sedation medicines used during the test).    FOLLOW UP: Our staff will call the number listed on your records the next business day following your procedure to check on you and address any questions or concerns that you may have regarding the information given to you following your procedure. If we do not reach you, we will leave a message.  However, if you are feeling well and you are not experiencing any problems, there is no need to return our call.  We will assume that you have returned to your regular daily activities without incident.  If any biopsies were taken you will be contacted by phone or by letter within the next 1-3 weeks.  Please call us at [phone] if you have not heard about the biopsies in 3 weeks.    SIGNATURES/CONFIDENTIALITY: You and/or your care partner have signed paperwork which will be entered into your electronic medical record.  These signatures attest to the fact that that the information above on your After Visit Summary has been reviewed and is understood.  Full responsibility of the confidentiality of this discharge information lies with you and/or your care-partner. 

## 2015-12-15 NOTE — Op Note (Signed)
[facility] Patient Name: [name] Procedure Date: 12/15/2015 8:35 AM MRN: [id number] Endoscopist: Ladene Artist , MD Age: 70 Referring MD:  Date of Birth: [birth year] Gender: Female Procedure:                Colonoscopy Indications:              Surveillance: Personal history of adenomatous                            polyps on last colonoscopy > 5 years ago Medicines:                Monitored Anesthesia Care Procedure:                Pre-Anesthesia Assessment:                           - Prior to the procedure, a History and Physical                            was performed, and patient medications and                            allergies were reviewed. The patient's tolerance of                            previous anesthesia was also reviewed. The risks                            and benefits of the procedure and the sedation                            options and risks were discussed with the patient.                            All questions were answered, and informed consent                            was obtained. Prior Anticoagulants: The patient has                            taken no previous anticoagulant or antiplatelet                            agents. ASA Grade Assessment: II - A patient with                            mild systemic disease. After reviewing the risks                            and benefits, the patient was deemed in                            satisfactory condition to undergo the procedure.  After obtaining informed consent, the colonoscope                            was passed under direct vision. Throughout the                            procedure, the patient's blood pressure, pulse, and                            oxygen saturations were monitored continuously. The                            Model PCF-H190L [id number]) scope was introduced                            through the anus and advanced to the the  cecum,                            identified by appendiceal orifice and ileocecal                            valve. The colonoscopy was performed without                            difficulty. The patient tolerated the procedure                            well. The quality of the bowel preparation was                            excellent. The ileocecal valve, appendiceal                            orifice, and rectum were photographed. Scope In: 8:39:49 AM Scope Out: 8:52:03 AM Scope Withdrawal Time: 0 hours 9 minutes 45 seconds  Total Procedure Duration: 0 hours 12 minutes 14 seconds  Findings:                 The digital rectal exam was normal.                           A 5 mm polyp was found in the sigmoid colon. The                            polyp was sessile. The polyp was removed with a                            cold snare. Resection and retrieval were complete.                           A few small-mouthed diverticula were found in the                            sigmoid colon. There was no evidence  of                            diverticular bleeding.                           The exam was otherwise normal throughout the                            examined colon.                           The retroflexed view of the distal rectum and anal                            verge was normal and showed no anal or rectal                            abnormalities. Complications:            No immediate complications. Estimated Blood Loss:     Estimated blood loss: none. Impression:               - One 5 mm polyp in the sigmoid colon, removed with                            a cold snare. Resected and retrieved.                           - Mild diverticulosis in the sigmoid colon. There                            was no evidence of diverticular bleeding. Recommendation:           - Patient has a contact number available for                            emergencies. The signs and symptoms of  potential                            delayed complications were discussed with the                            patient. Return to normal activities tomorrow.                            Written discharge instructions were provided to the                            patient.                           - Resume previous diet.                           - Continue present medications.                           -  Await pathology results.                           - Repeat colonoscopy in 5 years for surveillance. Ladene Artist, MD 12/15/2015 9:43:18 AM This report has been signed electronically.

## 2015-12-15 NOTE — Progress Notes (Signed)
Report to PACU, RN, vss, BBS= Clear.  

## 2015-12-16 ENCOUNTER — Telehealth: Payer: Self-pay

## 2015-12-16 NOTE — Telephone Encounter (Signed)
  Follow up Call-  Call back number 12/15/2015  Post procedure Call Back phone  # [phone]  Permission to leave phone message Yes     Patient questions:  Do you have a fever, pain , or abdominal swelling? No. Pain Score  0 *  Have you tolerated food without any problems? Yes.    Have you been able to return to your normal activities? Yes.    Do you have any questions about your discharge instructions: Diet   No. Medications  No. Follow up visit  No.  Do you have questions or concerns about your Care? No.  Actions: * If pain score is 4 or above: No action needed, pain <4.

## 2015-12-23 ENCOUNTER — Encounter: Payer: Self-pay | Admitting: Gastroenterology

## 2015-12-29 ENCOUNTER — Ambulatory Visit
Admission: RE | Admit: 2015-12-29 | Discharge: 2015-12-29 | Disposition: A | Discharge status: Home / Self Care | Payer: Medicare Other | Source: Ambulatory Visit

## 2015-12-29 DIAGNOSIS — Z1231 Encounter for screening mammogram for malignant neoplasm of breast: Secondary | ICD-10-CM

## 2016-04-16 ENCOUNTER — Emergency Department (HOSPITAL_COMMUNITY)
Admission: EM | Admit: 2016-04-16 | Discharge: 2016-04-16 | Disposition: A | Discharge status: Home / Self Care | Payer: Medicare Other | Attending: Emergency Medicine | Admitting: Emergency Medicine

## 2016-04-16 ENCOUNTER — Encounter (HOSPITAL_COMMUNITY): Payer: Self-pay | Admitting: Emergency Medicine

## 2016-04-16 ENCOUNTER — Emergency Department (HOSPITAL_BASED_OUTPATIENT_CLINIC_OR_DEPARTMENT_OTHER)
Admit: 2016-04-16 | Discharge: 2016-04-16 | Disposition: A | Discharge status: Home / Self Care | Payer: Medicare Other | Attending: Emergency Medicine | Admitting: Emergency Medicine

## 2016-04-16 DIAGNOSIS — M79609 Pain in unspecified limb: Secondary | ICD-10-CM | POA: Diagnosis not present

## 2016-04-16 DIAGNOSIS — Z87891 Personal history of nicotine dependence: Secondary | ICD-10-CM | POA: Insufficient documentation

## 2016-04-16 DIAGNOSIS — I10 Essential (primary) hypertension: Secondary | ICD-10-CM | POA: Insufficient documentation

## 2016-04-16 DIAGNOSIS — Z7982 Long term (current) use of aspirin: Secondary | ICD-10-CM | POA: Insufficient documentation

## 2016-04-16 DIAGNOSIS — M79662 Pain in left lower leg: Secondary | ICD-10-CM | POA: Diagnosis present

## 2016-04-16 DIAGNOSIS — I809 Phlebitis and thrombophlebitis of unspecified site: Secondary | ICD-10-CM | POA: Diagnosis not present

## 2016-04-16 DIAGNOSIS — Z85828 Personal history of other malignant neoplasm of skin: Secondary | ICD-10-CM | POA: Diagnosis not present

## 2016-04-16 MED ORDER — CEPHALEXIN 500 MG PO CAPS: 500.0000 mg | ORAL_CAPSULE | Freq: Three times a day (TID) | ORAL | 0 refills | Status: DC

## 2016-04-16 NOTE — ED Notes (Signed)
Pt is in stable condition upon d/c and ambulates from ED. 

## 2016-04-16 NOTE — ED Triage Notes (Signed)
Pt sts right leg swelling in verucose vein area with some pain x 3 days

## 2016-04-16 NOTE — ED Provider Notes (Signed)
[facility] DEPT Provider Note   CSN: [id number] Arrival date & time: 04/16/16  [id number]     History   Chief Complaint Chief Complaint  Patient presents with  . Leg Pain    HPI [name] is a 70 y.o. female.  The history is provided by the patient and medical records. No language interpreter was used.  Leg Pain     [name] is a 70 y.o. female  with a PMH of HTN, HLD who presents to the Emergency Department complaining of painful erythematous knot to right medial thigh that she first noticed yesterday afternoon. She applied ice to the area with little relief. Pain is worse with palpation. She endorses associated warmth. No known injury. No fever/chills, shortness of breath or chest pain. No recent travel or surgeries or immobilizations. No estrogen use.  Past Medical History:  Diagnosis Date  . Cataract    bilat  . Hyperlipidemia   . Hypertension   . Skin cancer    melanoma and basal cell and squamous cell  . Thyroid disease    doesnt have thyroid    Patient Active Problem List   Diagnosis Date Noted  . PERSONAL HX COLONIC POLYPS 08/10/2010    Past Surgical History:  Procedure Laterality Date  . ABDOMINAL HYSTERECTOMY     has ovaries  . CATARACT EXTRACTION, BILATERAL    . DILATION AND CURETTAGE OF UTERUS    . FOOT MASS EXCISION    . MELANOMA EXCISION      OB History    No data available       Home Medications    Prior to Admission medications   Medication Sig Start Date End Date Taking? Authorizing Provider  aspirin 81 MG tablet Take 81 mg by mouth daily.    Historical Provider, MD  atorvastatin (LIPITOR) 40 MG tablet Take 40 mg by mouth daily.    Historical Provider, MD  calcium carbonate (TUMS EX) 750 MG chewable tablet Chew 1 tablet by mouth daily. Ultra Chewable 400 mg.  Takes 2 daily.    Historical Provider, MD  cephALEXin (KEFLEX) 500 MG capsule Take 1 capsule (500 mg total) by mouth 3 (three) times daily. 04/16/16   Ozella Almond Zinia Innocent,  PA-C  cholecalciferol (VITAMIN D) 1000 units tablet Take 1,000 Units by mouth daily.    Historical Provider, MD  co-enzyme Q-10 30 MG capsule Take 30 mg by mouth 3 (three) times daily.    Historical Provider, MD  levothyroxine (SYNTHROID, LEVOTHROID) 100 MCG tablet Take 100 mcg by mouth daily before breakfast.    Historical Provider, MD  losartan-hydrochlorothiazide (HYZAAR) 50-12.5 MG tablet Take 1 tablet by mouth daily.    Historical Provider, MD  Multiple Vitamin (MULTIVITAMIN) tablet Take 1 tablet by mouth daily. Nature Adult    Historical Provider, MD  OVER THE COUNTER MEDICATION MegaRed omega 3 krill oil    Historical Provider, MD    Family History Family History  Problem Relation Age of Onset  . Colon cancer Paternal Grandfather     Social History Social History  Substance Use Topics  . Smoking status: Former Research scientist (life sciences)  . Smokeless tobacco: Never Used     Comment: quit 20 yrs ago  . Alcohol use 0.0 oz/week     Comment: once monthly MAYBE     Allergies   Chloramphenicol   Review of Systems Review of Systems  Constitutional: Negative for chills and fever.  HENT: Negative for congestion.   Eyes: Negative for visual disturbance.  Respiratory: Negative for cough and shortness of breath.   Cardiovascular: Negative for chest pain.  Gastrointestinal: Negative for abdominal pain.  Genitourinary: Negative for dysuria.  Musculoskeletal: Positive for myalgias (Right leg).  Skin: Positive for color change.  Neurological: Negative for headaches.     Physical Exam Updated Vital Signs BP 148/77   Pulse 73   Temp 98.6 F (37 C) (Oral)   Resp 15   SpO2 94%   Physical Exam  Constitutional: She is oriented to person, place, and time. She appears well-developed and well-nourished. No distress.  HENT:  Head: Normocephalic and atraumatic.  Cardiovascular: Normal rate, regular rhythm and normal heart sounds.   2+ DP bilateral LE's.   Pulmonary/Chest: Effort normal and breath  sounds normal. No respiratory distress. She has no wheezes. She has no rales.  Musculoskeletal:  Bilateral LE's with full ROM and 5/5 strength. Negative Homan's.   Neurological: She is alert and oriented to person, place, and time.  Bilateral lower extremities neurovascularly intact.   Skin: Skin is warm and dry.  Right medial leg: tender palpable knot with overlying erythema and warmth to the touch. No streaking.   Psychiatric: She has a normal mood and affect.  Nursing note and vitals reviewed.    ED Treatments / Results  Labs (all labs ordered are listed, but only abnormal results are displayed) Labs Reviewed - No data to display  EKG  EKG Interpretation None       Radiology No results found.  Procedures Procedures (including critical care time)  Medications Ordered in ED Medications - No data to display   Initial Impression / Assessment and Plan / ED Course  I have reviewed the triage vital signs and the nursing notes.  Pertinent labs & imaging results that were available during my care of the patient were reviewed by me and considered in my medical decision making (see chart for details).  Clinical Course   [name] is a 70 y.o. female who presents to ED for tender palpable knot to right leg. Likely superficial thrombophlebitis, ultrasound ordered to rule out DVT.  Ultrasound negative for DVT. She does have an acute localized superficial vein thrombosis in the area of concern. Will treat with keflex. Patient takes ASA three times a week - start taking ASA daily until symptoms resolve then back to usual regimen. PCP follow up. Symptomatic home care instructions discussed and all questions answered.   Patient seen by and discussed with Dr. Christy Gentles who agrees with treatment plan.    Final Clinical Impressions(s) / ED Diagnoses   Final diagnoses:  Superficial thrombophlebitis    New Prescriptions New Prescriptions   CEPHALEXIN (KEFLEX) 500 MG CAPSULE     Take 1 capsule (500 mg total) by mouth 3 (three) times daily.     [facility] Kristof Nadeem, PA-C 04/16/16 1237    Ripley Fraise, MD 04/19/16 [id number]

## 2016-04-16 NOTE — Discharge Instructions (Signed)
It was my pleasure taking care of you today!  Take aspirin daily until symptoms resolve, then you can go back to your usual aspirin regimen.  Please take all of your antibiotics until finished! Follow up with your primary care physician or return to ER for new or worsening symptoms, any additional concerns.

## 2016-04-16 NOTE — Progress Notes (Signed)
Preliminary results by tech - Right Lower Ext. Venous Duplex Completed. Negative for deep vein thrombosis. Positive acute localized superficial vein thrombosis at the medial right knee aspect.  Results given to Elmyra Ricks, patient's nurse. Oda Cogan, BS, RDMS, RVT

## 2016-04-16 NOTE — ED Provider Notes (Signed)
Patient seen/examined in the Emergency Department in conjunction with Midlevel Provider ward Patient reports right thigh pain/redness Exam : awake/alert, mild tenderness with erythema to right distal thigh Plan: plan for vascular ultrasound Pt stable a this time She denies CP/SOB     Ripley Fraise, MD 04/16/16 1107

## 2016-08-09 DIAGNOSIS — I8311 Varicose veins of right lower extremity with inflammation: Secondary | ICD-10-CM | POA: Diagnosis not present

## 2016-08-09 DIAGNOSIS — I8001 Phlebitis and thrombophlebitis of superficial vessels of right lower extremity: Secondary | ICD-10-CM | POA: Diagnosis not present

## 2016-08-20 DIAGNOSIS — L718 Other rosacea: Secondary | ICD-10-CM | POA: Diagnosis not present

## 2016-08-20 DIAGNOSIS — L821 Other seborrheic keratosis: Secondary | ICD-10-CM | POA: Diagnosis not present

## 2016-08-20 DIAGNOSIS — Z85820 Personal history of malignant melanoma of skin: Secondary | ICD-10-CM | POA: Diagnosis not present

## 2016-08-20 DIAGNOSIS — Z85828 Personal history of other malignant neoplasm of skin: Secondary | ICD-10-CM | POA: Diagnosis not present

## 2016-08-20 DIAGNOSIS — D2272 Melanocytic nevi of left lower limb, including hip: Secondary | ICD-10-CM | POA: Diagnosis not present

## 2016-08-20 DIAGNOSIS — L905 Scar conditions and fibrosis of skin: Secondary | ICD-10-CM | POA: Diagnosis not present

## 2016-08-20 DIAGNOSIS — L570 Actinic keratosis: Secondary | ICD-10-CM | POA: Diagnosis not present

## 2016-08-20 DIAGNOSIS — D2271 Melanocytic nevi of right lower limb, including hip: Secondary | ICD-10-CM | POA: Diagnosis not present

## 2016-08-20 DIAGNOSIS — L814 Other melanin hyperpigmentation: Secondary | ICD-10-CM | POA: Diagnosis not present

## 2016-08-23 DIAGNOSIS — N390 Urinary tract infection, site not specified: Secondary | ICD-10-CM | POA: Diagnosis not present

## 2016-08-23 DIAGNOSIS — I10 Essential (primary) hypertension: Secondary | ICD-10-CM | POA: Diagnosis not present

## 2016-08-23 DIAGNOSIS — E039 Hypothyroidism, unspecified: Secondary | ICD-10-CM | POA: Diagnosis not present

## 2016-08-30 DIAGNOSIS — E039 Hypothyroidism, unspecified: Secondary | ICD-10-CM | POA: Diagnosis not present

## 2016-08-30 DIAGNOSIS — E7800 Pure hypercholesterolemia, unspecified: Secondary | ICD-10-CM | POA: Diagnosis not present

## 2016-08-30 DIAGNOSIS — Z0000 Encounter for general adult medical examination without abnormal findings: Secondary | ICD-10-CM | POA: Diagnosis not present

## 2016-08-30 DIAGNOSIS — I10 Essential (primary) hypertension: Secondary | ICD-10-CM | POA: Diagnosis not present

## 2016-09-22 DIAGNOSIS — I83891 Varicose veins of right lower extremities with other complications: Secondary | ICD-10-CM | POA: Diagnosis not present

## 2016-09-22 DIAGNOSIS — I8311 Varicose veins of right lower extremity with inflammation: Secondary | ICD-10-CM | POA: Diagnosis not present

## 2016-09-24 DIAGNOSIS — I8311 Varicose veins of right lower extremity with inflammation: Secondary | ICD-10-CM | POA: Diagnosis not present

## 2016-09-24 DIAGNOSIS — I83891 Varicose veins of right lower extremities with other complications: Secondary | ICD-10-CM | POA: Diagnosis not present

## 2016-10-13 DIAGNOSIS — I83891 Varicose veins of right lower extremities with other complications: Secondary | ICD-10-CM | POA: Diagnosis not present

## 2016-10-13 DIAGNOSIS — I8311 Varicose veins of right lower extremity with inflammation: Secondary | ICD-10-CM | POA: Diagnosis not present

## 2016-11-10 DIAGNOSIS — I8001 Phlebitis and thrombophlebitis of superficial vessels of right lower extremity: Secondary | ICD-10-CM | POA: Diagnosis not present

## 2016-11-17 DIAGNOSIS — Z961 Presence of intraocular lens: Secondary | ICD-10-CM | POA: Diagnosis not present

## 2016-12-08 ENCOUNTER — Other Ambulatory Visit: Payer: Self-pay | Admitting: Internal Medicine

## 2016-12-08 DIAGNOSIS — Z1231 Encounter for screening mammogram for malignant neoplasm of breast: Secondary | ICD-10-CM

## 2017-01-12 ENCOUNTER — Ambulatory Visit
Admission: RE | Admit: 2017-01-12 | Discharge: 2017-01-12 | Disposition: A | Discharge status: Home / Self Care | Payer: Medicare HMO | Source: Ambulatory Visit | Attending: Internal Medicine | Admitting: Internal Medicine

## 2017-01-12 DIAGNOSIS — Z1231 Encounter for screening mammogram for malignant neoplasm of breast: Secondary | ICD-10-CM

## 2017-02-14 DIAGNOSIS — H02055 Trichiasis without entropian left lower eyelid: Secondary | ICD-10-CM | POA: Diagnosis not present

## 2017-03-15 DIAGNOSIS — M545 Low back pain: Secondary | ICD-10-CM | POA: Diagnosis not present

## 2017-03-15 DIAGNOSIS — M25551 Pain in right hip: Secondary | ICD-10-CM | POA: Diagnosis not present

## 2017-03-23 ENCOUNTER — Other Ambulatory Visit: Payer: Self-pay | Admitting: Orthopedic Surgery

## 2017-03-23 DIAGNOSIS — M545 Low back pain: Secondary | ICD-10-CM

## 2017-03-24 ENCOUNTER — Ambulatory Visit
Admission: RE | Admit: 2017-03-24 | Discharge: 2017-03-24 | Disposition: A | Discharge status: Home / Self Care | Payer: Medicare HMO | Source: Ambulatory Visit | Attending: Orthopedic Surgery | Admitting: Orthopedic Surgery

## 2017-03-24 DIAGNOSIS — M48061 Spinal stenosis, lumbar region without neurogenic claudication: Secondary | ICD-10-CM | POA: Diagnosis not present

## 2017-03-24 DIAGNOSIS — M545 Low back pain: Secondary | ICD-10-CM

## 2017-04-05 DIAGNOSIS — M545 Low back pain: Secondary | ICD-10-CM | POA: Diagnosis not present

## 2017-04-05 DIAGNOSIS — M5416 Radiculopathy, lumbar region: Secondary | ICD-10-CM | POA: Diagnosis not present

## 2017-04-14 DIAGNOSIS — M5416 Radiculopathy, lumbar region: Secondary | ICD-10-CM | POA: Diagnosis not present

## 2017-04-14 DIAGNOSIS — M545 Low back pain: Secondary | ICD-10-CM | POA: Diagnosis not present

## 2017-05-09 DIAGNOSIS — M545 Low back pain: Secondary | ICD-10-CM | POA: Diagnosis not present

## 2017-05-09 DIAGNOSIS — M5416 Radiculopathy, lumbar region: Secondary | ICD-10-CM | POA: Diagnosis not present

## 2017-05-16 DIAGNOSIS — M48061 Spinal stenosis, lumbar region without neurogenic claudication: Secondary | ICD-10-CM | POA: Diagnosis not present

## 2017-05-16 DIAGNOSIS — M5416 Radiculopathy, lumbar region: Secondary | ICD-10-CM | POA: Diagnosis not present

## 2017-05-16 DIAGNOSIS — R262 Difficulty in walking, not elsewhere classified: Secondary | ICD-10-CM | POA: Diagnosis not present

## 2017-05-20 DIAGNOSIS — M5416 Radiculopathy, lumbar region: Secondary | ICD-10-CM | POA: Diagnosis not present

## 2017-05-20 DIAGNOSIS — R262 Difficulty in walking, not elsewhere classified: Secondary | ICD-10-CM | POA: Diagnosis not present

## 2017-05-20 DIAGNOSIS — M48061 Spinal stenosis, lumbar region without neurogenic claudication: Secondary | ICD-10-CM | POA: Diagnosis not present

## 2017-05-25 DIAGNOSIS — M5416 Radiculopathy, lumbar region: Secondary | ICD-10-CM | POA: Diagnosis not present

## 2017-05-25 DIAGNOSIS — M48061 Spinal stenosis, lumbar region without neurogenic claudication: Secondary | ICD-10-CM | POA: Diagnosis not present

## 2017-05-25 DIAGNOSIS — R262 Difficulty in walking, not elsewhere classified: Secondary | ICD-10-CM | POA: Diagnosis not present

## 2017-05-27 DIAGNOSIS — R262 Difficulty in walking, not elsewhere classified: Secondary | ICD-10-CM | POA: Diagnosis not present

## 2017-05-27 DIAGNOSIS — M48061 Spinal stenosis, lumbar region without neurogenic claudication: Secondary | ICD-10-CM | POA: Diagnosis not present

## 2017-05-27 DIAGNOSIS — M5416 Radiculopathy, lumbar region: Secondary | ICD-10-CM | POA: Diagnosis not present

## 2017-05-30 DIAGNOSIS — R262 Difficulty in walking, not elsewhere classified: Secondary | ICD-10-CM | POA: Diagnosis not present

## 2017-05-30 DIAGNOSIS — M5416 Radiculopathy, lumbar region: Secondary | ICD-10-CM | POA: Diagnosis not present

## 2017-05-30 DIAGNOSIS — M48061 Spinal stenosis, lumbar region without neurogenic claudication: Secondary | ICD-10-CM | POA: Diagnosis not present

## 2017-06-03 DIAGNOSIS — M5416 Radiculopathy, lumbar region: Secondary | ICD-10-CM | POA: Diagnosis not present

## 2017-06-03 DIAGNOSIS — R262 Difficulty in walking, not elsewhere classified: Secondary | ICD-10-CM | POA: Diagnosis not present

## 2017-06-03 DIAGNOSIS — M48061 Spinal stenosis, lumbar region without neurogenic claudication: Secondary | ICD-10-CM | POA: Diagnosis not present

## 2017-06-07 DIAGNOSIS — M48061 Spinal stenosis, lumbar region without neurogenic claudication: Secondary | ICD-10-CM | POA: Diagnosis not present

## 2017-06-07 DIAGNOSIS — R262 Difficulty in walking, not elsewhere classified: Secondary | ICD-10-CM | POA: Diagnosis not present

## 2017-06-07 DIAGNOSIS — M5416 Radiculopathy, lumbar region: Secondary | ICD-10-CM | POA: Diagnosis not present

## 2017-06-09 DIAGNOSIS — R262 Difficulty in walking, not elsewhere classified: Secondary | ICD-10-CM | POA: Diagnosis not present

## 2017-06-09 DIAGNOSIS — M48061 Spinal stenosis, lumbar region without neurogenic claudication: Secondary | ICD-10-CM | POA: Diagnosis not present

## 2017-06-09 DIAGNOSIS — M5416 Radiculopathy, lumbar region: Secondary | ICD-10-CM | POA: Diagnosis not present

## 2017-06-13 DIAGNOSIS — M48061 Spinal stenosis, lumbar region without neurogenic claudication: Secondary | ICD-10-CM | POA: Diagnosis not present

## 2017-06-13 DIAGNOSIS — R262 Difficulty in walking, not elsewhere classified: Secondary | ICD-10-CM | POA: Diagnosis not present

## 2017-06-13 DIAGNOSIS — M5416 Radiculopathy, lumbar region: Secondary | ICD-10-CM | POA: Diagnosis not present

## 2017-06-20 DIAGNOSIS — R262 Difficulty in walking, not elsewhere classified: Secondary | ICD-10-CM | POA: Diagnosis not present

## 2017-06-20 DIAGNOSIS — M48061 Spinal stenosis, lumbar region without neurogenic claudication: Secondary | ICD-10-CM | POA: Diagnosis not present

## 2017-06-20 DIAGNOSIS — M5416 Radiculopathy, lumbar region: Secondary | ICD-10-CM | POA: Diagnosis not present

## 2017-06-21 DIAGNOSIS — R2231 Localized swelling, mass and lump, right upper limb: Secondary | ICD-10-CM | POA: Diagnosis not present

## 2017-06-22 ENCOUNTER — Other Ambulatory Visit: Payer: Self-pay | Admitting: Orthopedic Surgery

## 2017-06-23 ENCOUNTER — Other Ambulatory Visit: Payer: Self-pay | Admitting: Orthopedic Surgery

## 2017-06-24 ENCOUNTER — Other Ambulatory Visit: Payer: Self-pay | Admitting: Orthopedic Surgery

## 2017-06-24 DIAGNOSIS — R2231 Localized swelling, mass and lump, right upper limb: Secondary | ICD-10-CM

## 2017-06-24 DIAGNOSIS — R262 Difficulty in walking, not elsewhere classified: Secondary | ICD-10-CM | POA: Diagnosis not present

## 2017-06-24 DIAGNOSIS — M48061 Spinal stenosis, lumbar region without neurogenic claudication: Secondary | ICD-10-CM | POA: Diagnosis not present

## 2017-06-24 DIAGNOSIS — M5416 Radiculopathy, lumbar region: Secondary | ICD-10-CM | POA: Diagnosis not present

## 2017-06-27 ENCOUNTER — Ambulatory Visit
Admission: RE | Admit: 2017-06-27 | Discharge: 2017-06-27 | Disposition: A | Discharge status: Home / Self Care | Payer: Medicare HMO | Source: Ambulatory Visit | Attending: Orthopedic Surgery | Admitting: Orthopedic Surgery

## 2017-06-27 DIAGNOSIS — M5416 Radiculopathy, lumbar region: Secondary | ICD-10-CM | POA: Diagnosis not present

## 2017-06-27 DIAGNOSIS — M48061 Spinal stenosis, lumbar region without neurogenic claudication: Secondary | ICD-10-CM | POA: Diagnosis not present

## 2017-06-27 DIAGNOSIS — M545 Low back pain: Secondary | ICD-10-CM | POA: Diagnosis not present

## 2017-06-27 DIAGNOSIS — R2231 Localized swelling, mass and lump, right upper limb: Secondary | ICD-10-CM

## 2017-06-30 DIAGNOSIS — M48061 Spinal stenosis, lumbar region without neurogenic claudication: Secondary | ICD-10-CM | POA: Diagnosis not present

## 2017-06-30 DIAGNOSIS — R262 Difficulty in walking, not elsewhere classified: Secondary | ICD-10-CM | POA: Diagnosis not present

## 2017-06-30 DIAGNOSIS — M5416 Radiculopathy, lumbar region: Secondary | ICD-10-CM | POA: Diagnosis not present

## 2017-07-01 DIAGNOSIS — R262 Difficulty in walking, not elsewhere classified: Secondary | ICD-10-CM | POA: Diagnosis not present

## 2017-07-01 DIAGNOSIS — M48061 Spinal stenosis, lumbar region without neurogenic claudication: Secondary | ICD-10-CM | POA: Diagnosis not present

## 2017-07-01 DIAGNOSIS — M5416 Radiculopathy, lumbar region: Secondary | ICD-10-CM | POA: Diagnosis not present

## 2017-07-06 DIAGNOSIS — R262 Difficulty in walking, not elsewhere classified: Secondary | ICD-10-CM | POA: Diagnosis not present

## 2017-07-06 DIAGNOSIS — M48061 Spinal stenosis, lumbar region without neurogenic claudication: Secondary | ICD-10-CM | POA: Diagnosis not present

## 2017-07-06 DIAGNOSIS — M5416 Radiculopathy, lumbar region: Secondary | ICD-10-CM | POA: Diagnosis not present

## 2017-07-07 DIAGNOSIS — M5416 Radiculopathy, lumbar region: Secondary | ICD-10-CM | POA: Diagnosis not present

## 2017-07-07 DIAGNOSIS — M48061 Spinal stenosis, lumbar region without neurogenic claudication: Secondary | ICD-10-CM | POA: Diagnosis not present

## 2017-07-07 DIAGNOSIS — R262 Difficulty in walking, not elsewhere classified: Secondary | ICD-10-CM | POA: Diagnosis not present

## 2017-08-05 DIAGNOSIS — L821 Other seborrheic keratosis: Secondary | ICD-10-CM | POA: Diagnosis not present

## 2017-08-05 DIAGNOSIS — Z85820 Personal history of malignant melanoma of skin: Secondary | ICD-10-CM | POA: Diagnosis not present

## 2017-08-05 DIAGNOSIS — L308 Other specified dermatitis: Secondary | ICD-10-CM | POA: Diagnosis not present

## 2017-08-05 DIAGNOSIS — L853 Xerosis cutis: Secondary | ICD-10-CM | POA: Diagnosis not present

## 2017-08-05 DIAGNOSIS — Z85828 Personal history of other malignant neoplasm of skin: Secondary | ICD-10-CM | POA: Diagnosis not present

## 2017-08-05 DIAGNOSIS — L820 Inflamed seborrheic keratosis: Secondary | ICD-10-CM | POA: Diagnosis not present

## 2017-08-05 DIAGNOSIS — D2239 Melanocytic nevi of other parts of face: Secondary | ICD-10-CM | POA: Diagnosis not present

## 2017-08-05 DIAGNOSIS — L718 Other rosacea: Secondary | ICD-10-CM | POA: Diagnosis not present

## 2017-08-15 DIAGNOSIS — M5416 Radiculopathy, lumbar region: Secondary | ICD-10-CM | POA: Diagnosis not present

## 2017-08-15 DIAGNOSIS — M545 Low back pain: Secondary | ICD-10-CM | POA: Diagnosis not present

## 2017-08-15 DIAGNOSIS — M48061 Spinal stenosis, lumbar region without neurogenic claudication: Secondary | ICD-10-CM | POA: Diagnosis not present

## 2017-08-29 DIAGNOSIS — I10 Essential (primary) hypertension: Secondary | ICD-10-CM | POA: Diagnosis not present

## 2017-08-29 DIAGNOSIS — M859 Disorder of bone density and structure, unspecified: Secondary | ICD-10-CM | POA: Diagnosis not present

## 2017-08-29 DIAGNOSIS — M8580 Other specified disorders of bone density and structure, unspecified site: Secondary | ICD-10-CM | POA: Diagnosis not present

## 2017-08-29 DIAGNOSIS — E039 Hypothyroidism, unspecified: Secondary | ICD-10-CM | POA: Diagnosis not present

## 2017-09-08 DIAGNOSIS — E7800 Pure hypercholesterolemia, unspecified: Secondary | ICD-10-CM | POA: Diagnosis not present

## 2017-09-08 DIAGNOSIS — Z0000 Encounter for general adult medical examination without abnormal findings: Secondary | ICD-10-CM | POA: Diagnosis not present

## 2017-09-08 DIAGNOSIS — E039 Hypothyroidism, unspecified: Secondary | ICD-10-CM | POA: Diagnosis not present

## 2017-11-10 DIAGNOSIS — I8311 Varicose veins of right lower extremity with inflammation: Secondary | ICD-10-CM | POA: Diagnosis not present

## 2017-11-17 DIAGNOSIS — H5212 Myopia, left eye: Secondary | ICD-10-CM | POA: Diagnosis not present

## 2017-11-28 DIAGNOSIS — M25561 Pain in right knee: Secondary | ICD-10-CM | POA: Diagnosis not present

## 2017-12-16 DIAGNOSIS — M1711 Unilateral primary osteoarthritis, right knee: Secondary | ICD-10-CM | POA: Diagnosis not present

## 2018-01-04 DIAGNOSIS — M1711 Unilateral primary osteoarthritis, right knee: Secondary | ICD-10-CM | POA: Diagnosis not present

## 2018-01-11 DIAGNOSIS — M1711 Unilateral primary osteoarthritis, right knee: Secondary | ICD-10-CM | POA: Diagnosis not present

## 2018-01-18 DIAGNOSIS — M1711 Unilateral primary osteoarthritis, right knee: Secondary | ICD-10-CM | POA: Diagnosis not present

## 2018-02-13 ENCOUNTER — Other Ambulatory Visit: Payer: Self-pay | Admitting: Internal Medicine

## 2018-02-13 DIAGNOSIS — Z1231 Encounter for screening mammogram for malignant neoplasm of breast: Secondary | ICD-10-CM

## 2018-03-06 DIAGNOSIS — E039 Hypothyroidism, unspecified: Secondary | ICD-10-CM | POA: Diagnosis not present

## 2018-03-06 DIAGNOSIS — E7800 Pure hypercholesterolemia, unspecified: Secondary | ICD-10-CM | POA: Diagnosis not present

## 2018-03-07 ENCOUNTER — Ambulatory Visit
Admission: RE | Admit: 2018-03-07 | Discharge: 2018-03-07 | Disposition: A | Discharge status: Home / Self Care | Payer: Medicare HMO | Source: Ambulatory Visit | Attending: Internal Medicine | Admitting: Internal Medicine

## 2018-03-07 DIAGNOSIS — Z1231 Encounter for screening mammogram for malignant neoplasm of breast: Secondary | ICD-10-CM | POA: Diagnosis not present

## 2018-05-15 DIAGNOSIS — Z23 Encounter for immunization: Secondary | ICD-10-CM | POA: Diagnosis not present

## 2018-08-10 DIAGNOSIS — Z85820 Personal history of malignant melanoma of skin: Secondary | ICD-10-CM | POA: Diagnosis not present

## 2018-08-10 DIAGNOSIS — L570 Actinic keratosis: Secondary | ICD-10-CM | POA: Diagnosis not present

## 2018-08-10 DIAGNOSIS — C44311 Basal cell carcinoma of skin of nose: Secondary | ICD-10-CM | POA: Diagnosis not present

## 2018-08-10 DIAGNOSIS — Z85828 Personal history of other malignant neoplasm of skin: Secondary | ICD-10-CM | POA: Diagnosis not present

## 2018-08-10 DIAGNOSIS — L814 Other melanin hyperpigmentation: Secondary | ICD-10-CM | POA: Diagnosis not present

## 2018-08-10 DIAGNOSIS — L905 Scar conditions and fibrosis of skin: Secondary | ICD-10-CM | POA: Diagnosis not present

## 2018-08-10 DIAGNOSIS — D485 Neoplasm of uncertain behavior of skin: Secondary | ICD-10-CM | POA: Diagnosis not present

## 2018-08-10 DIAGNOSIS — L821 Other seborrheic keratosis: Secondary | ICD-10-CM | POA: Diagnosis not present

## 2018-08-10 DIAGNOSIS — C44719 Basal cell carcinoma of skin of left lower limb, including hip: Secondary | ICD-10-CM | POA: Diagnosis not present

## 2018-08-23 DIAGNOSIS — T1512XA Foreign body in conjunctival sac, left eye, initial encounter: Secondary | ICD-10-CM | POA: Diagnosis not present

## 2018-08-31 DIAGNOSIS — C44719 Basal cell carcinoma of skin of left lower limb, including hip: Secondary | ICD-10-CM | POA: Diagnosis not present

## 2018-09-06 DIAGNOSIS — E7800 Pure hypercholesterolemia, unspecified: Secondary | ICD-10-CM | POA: Diagnosis not present

## 2018-09-06 DIAGNOSIS — Z0001 Encounter for general adult medical examination with abnormal findings: Secondary | ICD-10-CM | POA: Diagnosis not present

## 2018-09-06 DIAGNOSIS — I10 Essential (primary) hypertension: Secondary | ICD-10-CM | POA: Diagnosis not present

## 2018-09-13 DIAGNOSIS — I10 Essential (primary) hypertension: Secondary | ICD-10-CM | POA: Diagnosis not present

## 2018-09-13 DIAGNOSIS — E039 Hypothyroidism, unspecified: Secondary | ICD-10-CM | POA: Diagnosis not present

## 2018-09-13 DIAGNOSIS — Z0000 Encounter for general adult medical examination without abnormal findings: Secondary | ICD-10-CM | POA: Diagnosis not present

## 2018-09-13 DIAGNOSIS — Z01419 Encounter for gynecological examination (general) (routine) without abnormal findings: Secondary | ICD-10-CM | POA: Diagnosis not present

## 2018-09-13 DIAGNOSIS — Z1212 Encounter for screening for malignant neoplasm of rectum: Secondary | ICD-10-CM | POA: Diagnosis not present

## 2018-09-13 DIAGNOSIS — E7800 Pure hypercholesterolemia, unspecified: Secondary | ICD-10-CM | POA: Diagnosis not present

## 2018-09-19 DIAGNOSIS — Z85820 Personal history of malignant melanoma of skin: Secondary | ICD-10-CM | POA: Diagnosis not present

## 2018-09-19 DIAGNOSIS — Z85828 Personal history of other malignant neoplasm of skin: Secondary | ICD-10-CM | POA: Diagnosis not present

## 2018-09-19 DIAGNOSIS — C44311 Basal cell carcinoma of skin of nose: Secondary | ICD-10-CM | POA: Diagnosis not present

## 2019-01-31 ENCOUNTER — Other Ambulatory Visit: Payer: Self-pay | Admitting: Internal Medicine

## 2019-01-31 DIAGNOSIS — Z1231 Encounter for screening mammogram for malignant neoplasm of breast: Secondary | ICD-10-CM

## 2019-03-07 DIAGNOSIS — E7800 Pure hypercholesterolemia, unspecified: Secondary | ICD-10-CM | POA: Diagnosis not present

## 2019-03-09 DIAGNOSIS — H5213 Myopia, bilateral: Secondary | ICD-10-CM | POA: Diagnosis not present

## 2019-03-13 ENCOUNTER — Other Ambulatory Visit: Payer: Self-pay

## 2019-03-13 ENCOUNTER — Ambulatory Visit
Admission: RE | Admit: 2019-03-13 | Discharge: 2019-03-13 | Disposition: A | Discharge status: Home / Self Care | Payer: Medicare HMO | Source: Ambulatory Visit | Attending: Internal Medicine | Admitting: Internal Medicine

## 2019-03-13 DIAGNOSIS — Z1231 Encounter for screening mammogram for malignant neoplasm of breast: Secondary | ICD-10-CM

## 2019-03-14 DIAGNOSIS — E7800 Pure hypercholesterolemia, unspecified: Secondary | ICD-10-CM | POA: Diagnosis not present

## 2019-03-14 DIAGNOSIS — E039 Hypothyroidism, unspecified: Secondary | ICD-10-CM | POA: Diagnosis not present

## 2019-03-14 DIAGNOSIS — I10 Essential (primary) hypertension: Secondary | ICD-10-CM | POA: Diagnosis not present

## 2019-04-09 IMAGING — MG 2D DIGITAL SCREENING BILATERAL MAMMOGRAM WITH CAD AND ADJUNCT TO
8 of 12 series · 8 of 28 positions shown · non-contrast
Comparison: Previous exam(s).

CLINICAL DATA: Screening.

EXAM:
2D DIGITAL SCREENING BILATERAL MAMMOGRAM WITH CAD AND ADJUNCT TOMO

[R MLO synth-2D]
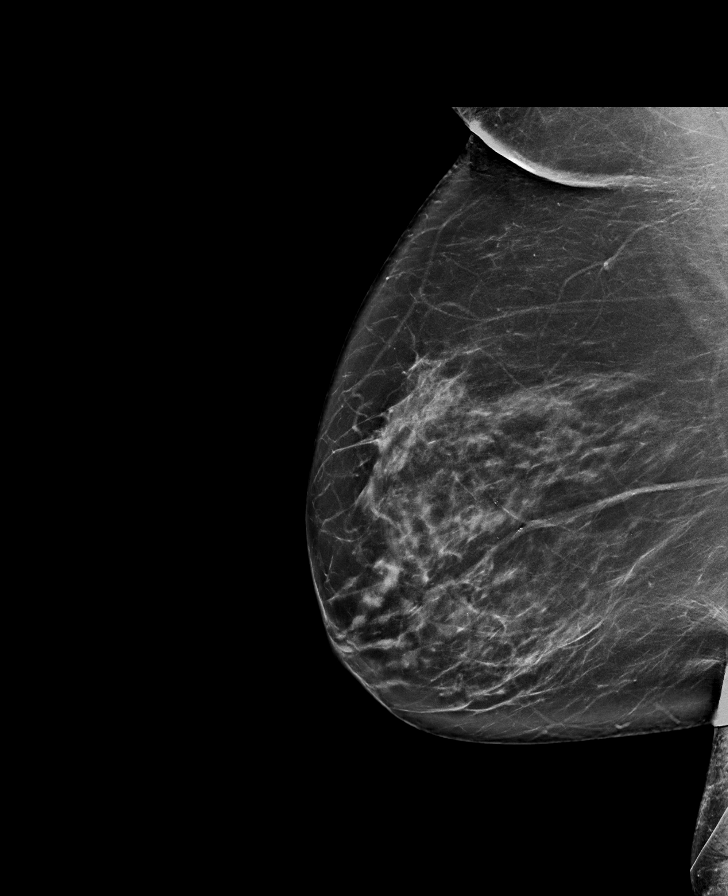

[L CC synth-2D]
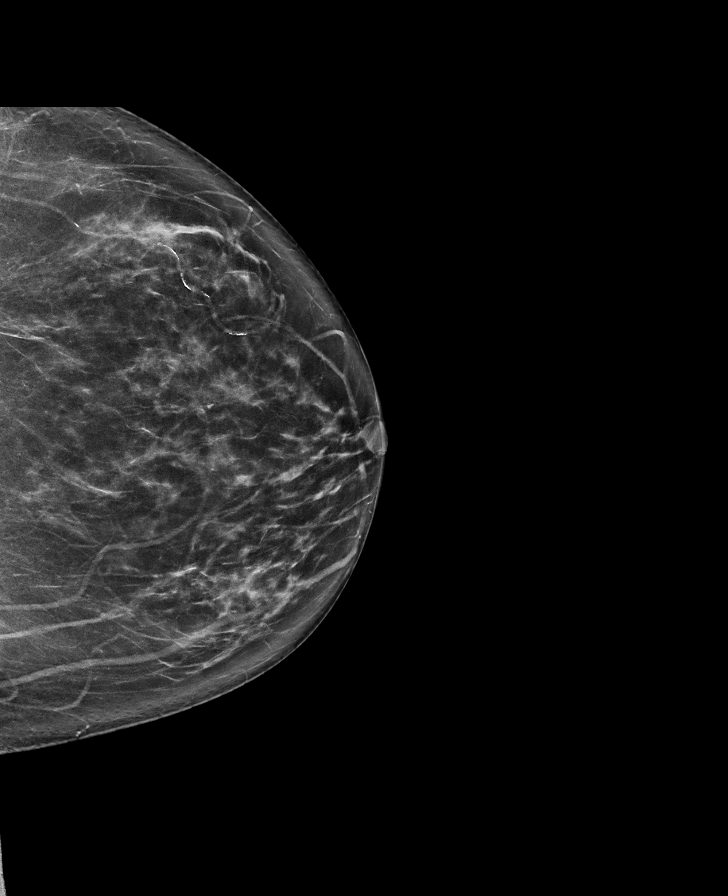

[L CC]
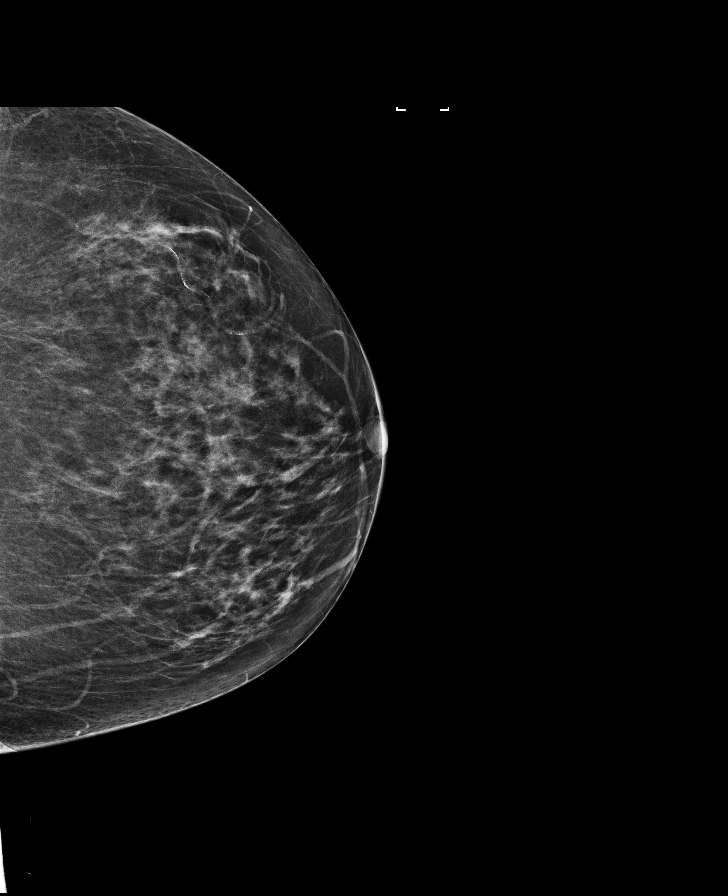

[L MLO]
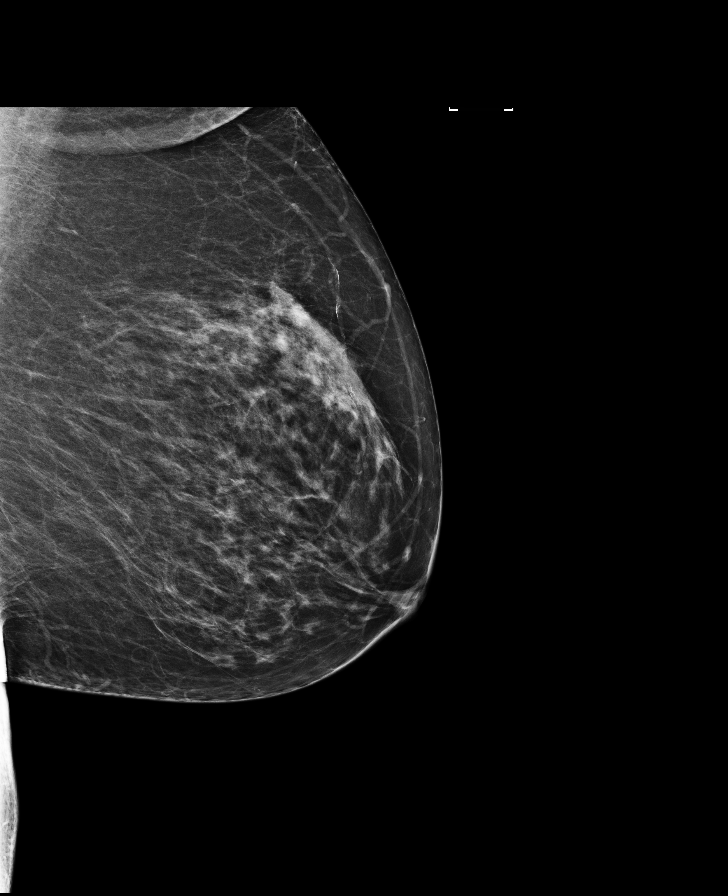

[L MLO synth-2D]
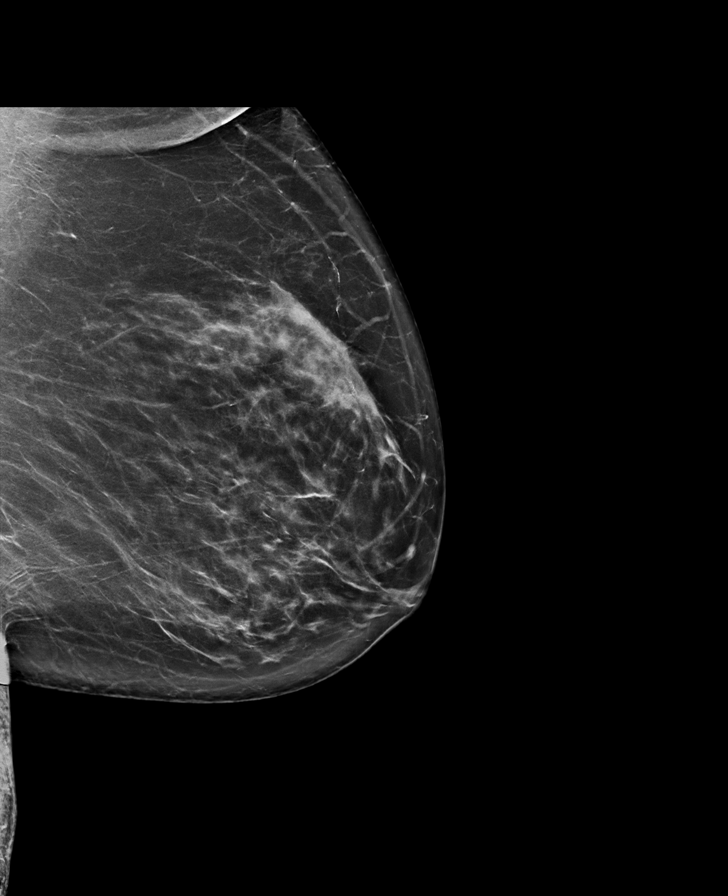

[R CC]
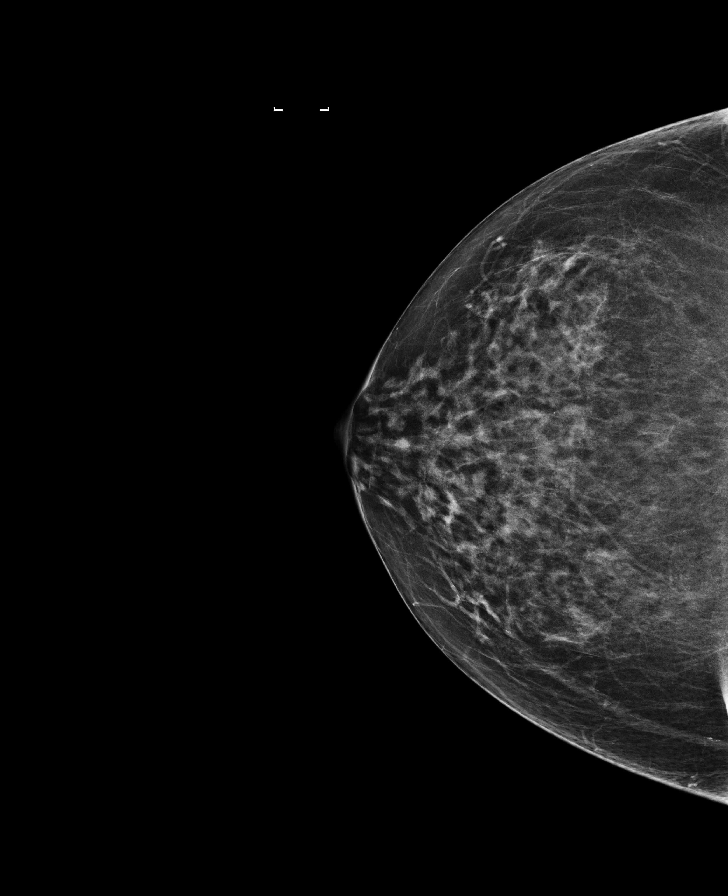

[R MLO]
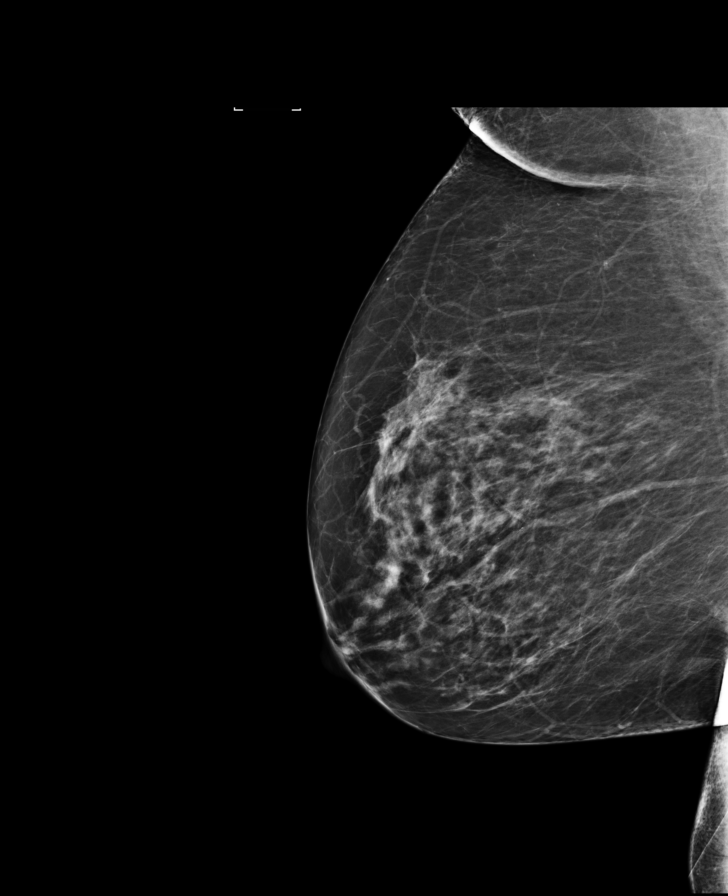

[R CC synth-2D]
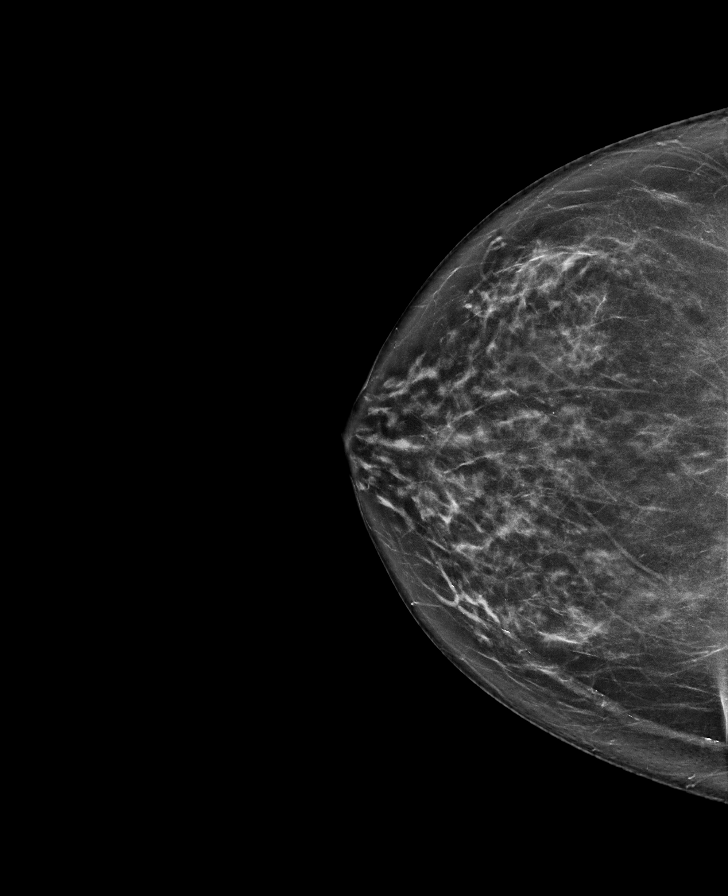

[8 of 28 positions shown; findings below may reference images not displayed]

ACR Breast Density Category b: There are scattered areas of
fibroglandular density.
FINDINGS: There are no findings suspicious for malignancy. Images were
processed with CAD.
IMPRESSION: No mammographic evidence of malignancy. A result letter of this
screening mammogram will be mailed directly to the patient.

RECOMMENDATION:
Screening mammogram in one year. (Code:97-6-RS4)

BI-RADS CATEGORY  1: Negative.

## 2019-06-05 DIAGNOSIS — I8311 Varicose veins of right lower extremity with inflammation: Secondary | ICD-10-CM | POA: Diagnosis not present

## 2019-06-19 ENCOUNTER — Other Ambulatory Visit: Payer: Self-pay

## 2019-06-19 DIAGNOSIS — Z20822 Contact with and (suspected) exposure to covid-19: Secondary | ICD-10-CM

## 2019-06-20 LAB — NOVEL CORONAVIRUS, NAA: SARS-CoV-2, NAA: NOT DETECTED

## 2019-08-12 ENCOUNTER — Ambulatory Visit: Payer: Medicare HMO | Attending: Internal Medicine

## 2019-08-12 DIAGNOSIS — Z23 Encounter for immunization: Secondary | ICD-10-CM | POA: Insufficient documentation

## 2019-08-12 NOTE — Progress Notes (Signed)
   Covid-19 Vaccination Clinic  Name:  [name]    MRN: [id number] DOB: [birth year]  08/12/2019  [name] was observed post Covid-19 immunization for 15 minutes without incidence. She was provided with Vaccine Information Sheet and instruction to access the V-Safe system.   [name] was instructed to call 911 with any severe reactions post vaccine: Marland Kitchen Difficulty breathing  . Swelling of your face and throat  . A fast heartbeat  . A bad rash all over your body  . Dizziness and weakness

## 2019-09-02 ENCOUNTER — Ambulatory Visit: Payer: Medicare HMO | Attending: Internal Medicine

## 2019-09-02 DIAGNOSIS — Z23 Encounter for immunization: Secondary | ICD-10-CM

## 2019-09-02 NOTE — Progress Notes (Signed)
   Covid-19 Vaccination Clinic  Name:  [name]    MRN: [id number] DOB: 1946/04/25  09/02/2019  [name] was observed post Covid-19 immunization for 15 minutes without incidence. She was provided with Vaccine Information Sheet and instruction to access the V-Safe system.   [name] was instructed to call 911 with any severe reactions post vaccine: Marland Kitchen Difficulty breathing  . Swelling of your face and throat  . A fast heartbeat  . A bad rash all over your body  . Dizziness and weakness    Immunizations Administered    Name Date Dose VIS Date Route   Pfizer COVID-19 Vaccine 09/02/2019 11:43 AM 0.3 mL 07/06/2019 Intramuscular   Manufacturer: [place]   Lot: [id number]   [place]: [id number]

## 2019-09-11 DIAGNOSIS — I10 Essential (primary) hypertension: Secondary | ICD-10-CM | POA: Diagnosis not present

## 2019-09-11 DIAGNOSIS — E039 Hypothyroidism, unspecified: Secondary | ICD-10-CM | POA: Diagnosis not present

## 2019-09-11 DIAGNOSIS — E7800 Pure hypercholesterolemia, unspecified: Secondary | ICD-10-CM | POA: Diagnosis not present

## 2019-09-11 DIAGNOSIS — Z0000 Encounter for general adult medical examination without abnormal findings: Secondary | ICD-10-CM | POA: Diagnosis not present

## 2019-09-18 DIAGNOSIS — Z1212 Encounter for screening for malignant neoplasm of rectum: Secondary | ICD-10-CM | POA: Diagnosis not present

## 2019-09-18 DIAGNOSIS — E039 Hypothyroidism, unspecified: Secondary | ICD-10-CM | POA: Diagnosis not present

## 2019-09-18 DIAGNOSIS — Z0000 Encounter for general adult medical examination without abnormal findings: Secondary | ICD-10-CM | POA: Diagnosis not present

## 2019-09-18 DIAGNOSIS — Z01419 Encounter for gynecological examination (general) (routine) without abnormal findings: Secondary | ICD-10-CM | POA: Diagnosis not present

## 2019-09-18 DIAGNOSIS — I10 Essential (primary) hypertension: Secondary | ICD-10-CM | POA: Diagnosis not present

## 2019-09-18 DIAGNOSIS — E7800 Pure hypercholesterolemia, unspecified: Secondary | ICD-10-CM | POA: Diagnosis not present

## 2019-09-19 DIAGNOSIS — L814 Other melanin hyperpigmentation: Secondary | ICD-10-CM | POA: Diagnosis not present

## 2019-09-19 DIAGNOSIS — L570 Actinic keratosis: Secondary | ICD-10-CM | POA: Diagnosis not present

## 2019-09-19 DIAGNOSIS — D225 Melanocytic nevi of trunk: Secondary | ICD-10-CM | POA: Diagnosis not present

## 2019-09-19 DIAGNOSIS — L821 Other seborrheic keratosis: Secondary | ICD-10-CM | POA: Diagnosis not present

## 2019-09-19 DIAGNOSIS — L720 Epidermal cyst: Secondary | ICD-10-CM | POA: Diagnosis not present

## 2019-09-19 DIAGNOSIS — Z85828 Personal history of other malignant neoplasm of skin: Secondary | ICD-10-CM | POA: Diagnosis not present

## 2019-09-19 DIAGNOSIS — L718 Other rosacea: Secondary | ICD-10-CM | POA: Diagnosis not present

## 2019-09-19 DIAGNOSIS — Z85820 Personal history of malignant melanoma of skin: Secondary | ICD-10-CM | POA: Diagnosis not present

## 2019-09-19 DIAGNOSIS — L910 Hypertrophic scar: Secondary | ICD-10-CM | POA: Diagnosis not present

## 2020-02-01 ENCOUNTER — Other Ambulatory Visit: Payer: Self-pay | Admitting: Registered Nurse

## 2020-02-01 DIAGNOSIS — Z1231 Encounter for screening mammogram for malignant neoplasm of breast: Secondary | ICD-10-CM

## 2020-03-04 DIAGNOSIS — I10 Essential (primary) hypertension: Secondary | ICD-10-CM | POA: Diagnosis not present

## 2020-03-04 DIAGNOSIS — E039 Hypothyroidism, unspecified: Secondary | ICD-10-CM | POA: Diagnosis not present

## 2020-03-04 DIAGNOSIS — E7800 Pure hypercholesterolemia, unspecified: Secondary | ICD-10-CM | POA: Diagnosis not present

## 2020-03-11 DIAGNOSIS — E039 Hypothyroidism, unspecified: Secondary | ICD-10-CM | POA: Diagnosis not present

## 2020-03-11 DIAGNOSIS — E7800 Pure hypercholesterolemia, unspecified: Secondary | ICD-10-CM | POA: Diagnosis not present

## 2020-03-11 DIAGNOSIS — I10 Essential (primary) hypertension: Secondary | ICD-10-CM | POA: Diagnosis not present

## 2020-03-11 DIAGNOSIS — E559 Vitamin D deficiency, unspecified: Secondary | ICD-10-CM | POA: Diagnosis not present

## 2020-03-17 ENCOUNTER — Ambulatory Visit
Admission: RE | Admit: 2020-03-17 | Discharge: 2020-03-17 | Disposition: A | Discharge status: Home / Self Care | Payer: Medicare HMO | Source: Ambulatory Visit | Attending: Registered Nurse | Admitting: Registered Nurse

## 2020-03-17 ENCOUNTER — Other Ambulatory Visit: Payer: Self-pay

## 2020-03-17 DIAGNOSIS — Z1231 Encounter for screening mammogram for malignant neoplasm of breast: Secondary | ICD-10-CM | POA: Diagnosis not present

## 2020-03-19 ENCOUNTER — Other Ambulatory Visit: Payer: Self-pay | Admitting: Registered Nurse

## 2020-03-19 DIAGNOSIS — R928 Other abnormal and inconclusive findings on diagnostic imaging of breast: Secondary | ICD-10-CM

## 2020-04-02 ENCOUNTER — Other Ambulatory Visit: Payer: Self-pay

## 2020-04-02 ENCOUNTER — Ambulatory Visit
Admission: RE | Admit: 2020-04-02 | Discharge: 2020-04-02 | Disposition: A | Discharge status: Home / Self Care | Payer: Medicare HMO | Source: Ambulatory Visit | Attending: Registered Nurse | Admitting: Registered Nurse

## 2020-04-02 ENCOUNTER — Other Ambulatory Visit: Payer: Self-pay | Admitting: Registered Nurse

## 2020-04-02 DIAGNOSIS — R921 Mammographic calcification found on diagnostic imaging of breast: Secondary | ICD-10-CM

## 2020-04-02 DIAGNOSIS — R928 Other abnormal and inconclusive findings on diagnostic imaging of breast: Secondary | ICD-10-CM | POA: Diagnosis not present

## 2020-04-10 ENCOUNTER — Other Ambulatory Visit: Payer: Self-pay

## 2020-04-10 ENCOUNTER — Ambulatory Visit
Admission: RE | Admit: 2020-04-10 | Discharge: 2020-04-10 | Disposition: A | Discharge status: Home / Self Care | Payer: Medicare HMO | Source: Ambulatory Visit | Attending: Registered Nurse | Admitting: Registered Nurse

## 2020-04-10 DIAGNOSIS — R921 Mammographic calcification found on diagnostic imaging of breast: Secondary | ICD-10-CM | POA: Diagnosis not present

## 2020-04-10 DIAGNOSIS — N6489 Other specified disorders of breast: Secondary | ICD-10-CM | POA: Diagnosis not present

## 2020-05-01 DIAGNOSIS — H5202 Hypermetropia, left eye: Secondary | ICD-10-CM | POA: Diagnosis not present

## 2020-05-06 DIAGNOSIS — E039 Hypothyroidism, unspecified: Secondary | ICD-10-CM | POA: Diagnosis not present

## 2020-06-10 ENCOUNTER — Ambulatory Visit: Payer: Medicare HMO | Attending: Internal Medicine

## 2020-06-10 ENCOUNTER — Other Ambulatory Visit: Payer: Self-pay | Admitting: Internal Medicine

## 2020-06-10 DIAGNOSIS — Z23 Encounter for immunization: Secondary | ICD-10-CM

## 2020-06-10 NOTE — Progress Notes (Signed)
° °  Covid-19 Vaccination Clinic  Name:  [name]    MRN: [id number] DOB: 31-Mar-1946  06/10/2020  [name] was observed post Covid-19 immunization for 15 minutes without incident. She was provided with Vaccine Information Sheet and instruction to access the V-Safe system.   [name] was instructed to call 911 with any severe reactions post vaccine:  Difficulty breathing   Swelling of face and throat   A fast heartbeat   A bad rash all over body   Dizziness and weakness   Immunizations Administered    Name Date Dose VIS Date Route   Pfizer COVID-19 Vaccine 06/10/2020  8:58 AM 0.3 mL 05/14/2020 Intramuscular   Manufacturer: [place]   Lot: [id number]   [place]: [id number]

## 2020-07-21 DIAGNOSIS — M1712 Unilateral primary osteoarthritis, left knee: Secondary | ICD-10-CM | POA: Diagnosis not present

## 2020-09-17 DIAGNOSIS — E039 Hypothyroidism, unspecified: Secondary | ICD-10-CM | POA: Diagnosis not present

## 2020-09-17 DIAGNOSIS — E559 Vitamin D deficiency, unspecified: Secondary | ICD-10-CM | POA: Diagnosis not present

## 2020-09-17 DIAGNOSIS — Z0000 Encounter for general adult medical examination without abnormal findings: Secondary | ICD-10-CM | POA: Diagnosis not present

## 2020-09-17 DIAGNOSIS — E7800 Pure hypercholesterolemia, unspecified: Secondary | ICD-10-CM | POA: Diagnosis not present

## 2020-09-17 DIAGNOSIS — I10 Essential (primary) hypertension: Secondary | ICD-10-CM | POA: Diagnosis not present

## 2020-09-18 DIAGNOSIS — Z85820 Personal history of malignant melanoma of skin: Secondary | ICD-10-CM | POA: Diagnosis not present

## 2020-09-18 DIAGNOSIS — Z85828 Personal history of other malignant neoplasm of skin: Secondary | ICD-10-CM | POA: Diagnosis not present

## 2020-09-18 DIAGNOSIS — D485 Neoplasm of uncertain behavior of skin: Secondary | ICD-10-CM | POA: Diagnosis not present

## 2020-09-18 DIAGNOSIS — D0461 Carcinoma in situ of skin of right upper limb, including shoulder: Secondary | ICD-10-CM | POA: Diagnosis not present

## 2020-09-18 DIAGNOSIS — L570 Actinic keratosis: Secondary | ICD-10-CM | POA: Diagnosis not present

## 2020-09-18 DIAGNOSIS — L821 Other seborrheic keratosis: Secondary | ICD-10-CM | POA: Diagnosis not present

## 2020-09-22 DIAGNOSIS — Z0000 Encounter for general adult medical examination without abnormal findings: Secondary | ICD-10-CM | POA: Diagnosis not present

## 2020-09-22 DIAGNOSIS — E559 Vitamin D deficiency, unspecified: Secondary | ICD-10-CM | POA: Diagnosis not present

## 2020-09-22 DIAGNOSIS — I10 Essential (primary) hypertension: Secondary | ICD-10-CM | POA: Diagnosis not present

## 2020-09-22 DIAGNOSIS — E7800 Pure hypercholesterolemia, unspecified: Secondary | ICD-10-CM | POA: Diagnosis not present

## 2020-09-22 DIAGNOSIS — E039 Hypothyroidism, unspecified: Secondary | ICD-10-CM | POA: Diagnosis not present

## 2020-10-07 DIAGNOSIS — Z85820 Personal history of malignant melanoma of skin: Secondary | ICD-10-CM | POA: Diagnosis not present

## 2020-10-07 DIAGNOSIS — C4441 Basal cell carcinoma of skin of scalp and neck: Secondary | ICD-10-CM | POA: Diagnosis not present

## 2020-10-07 DIAGNOSIS — Z85828 Personal history of other malignant neoplasm of skin: Secondary | ICD-10-CM | POA: Diagnosis not present

## 2020-10-22 DIAGNOSIS — D0461 Carcinoma in situ of skin of right upper limb, including shoulder: Secondary | ICD-10-CM | POA: Diagnosis not present

## 2020-11-22 DIAGNOSIS — I10 Essential (primary) hypertension: Secondary | ICD-10-CM | POA: Diagnosis not present

## 2020-11-22 DIAGNOSIS — E7800 Pure hypercholesterolemia, unspecified: Secondary | ICD-10-CM | POA: Diagnosis not present

## 2020-11-22 DIAGNOSIS — E039 Hypothyroidism, unspecified: Secondary | ICD-10-CM | POA: Diagnosis not present

## 2020-12-09 ENCOUNTER — Other Ambulatory Visit: Payer: Self-pay

## 2020-12-09 ENCOUNTER — Ambulatory Visit: Payer: Medicare HMO | Attending: Internal Medicine

## 2020-12-09 DIAGNOSIS — Z23 Encounter for immunization: Secondary | ICD-10-CM

## 2020-12-09 MED ORDER — PFIZER-BIONT COVID-19 VAC-TRIS 30 MCG/0.3ML IM SUSP
INTRAMUSCULAR | 0 refills | Status: DC
  Filled 2020-12-09: qty 0.3, 1d supply, fill #0

## 2020-12-09 NOTE — Progress Notes (Signed)
   Covid-19 Vaccination Clinic  Name:  [name]    MRN: [id number] DOB: Oct 03, 1945  12/09/2020  [name] was observed post Covid-19 immunization for 15 minutes without incident. She was provided with Vaccine Information Sheet and instruction to access the V-Safe system.   [name] was instructed to call 911 with any severe reactions post vaccine: Marland Kitchen Difficulty breathing  . Swelling of face and throat  . A fast heartbeat  . A bad rash all over body  . Dizziness and weakness   Immunizations Administered    Name Date Dose VIS Date Route   PFIZER Comrnaty(Gray TOP) Covid-19 Vaccine 12/09/2020 10:29 AM 0.3 mL 07/03/2020 Intramuscular   Manufacturer: [place]   Lot: [id number]   NDC: [place], PharmD, MBA Clinical Acute Care Pharmacist

## 2020-12-16 ENCOUNTER — Other Ambulatory Visit (HOSPITAL_BASED_OUTPATIENT_CLINIC_OR_DEPARTMENT_OTHER): Payer: Self-pay

## 2020-12-23 DIAGNOSIS — E039 Hypothyroidism, unspecified: Secondary | ICD-10-CM | POA: Diagnosis not present

## 2020-12-23 DIAGNOSIS — E7800 Pure hypercholesterolemia, unspecified: Secondary | ICD-10-CM | POA: Diagnosis not present

## 2020-12-23 DIAGNOSIS — I10 Essential (primary) hypertension: Secondary | ICD-10-CM | POA: Diagnosis not present

## 2021-02-22 DIAGNOSIS — E7800 Pure hypercholesterolemia, unspecified: Secondary | ICD-10-CM | POA: Diagnosis not present

## 2021-02-22 DIAGNOSIS — E039 Hypothyroidism, unspecified: Secondary | ICD-10-CM | POA: Diagnosis not present

## 2021-02-22 DIAGNOSIS — I10 Essential (primary) hypertension: Secondary | ICD-10-CM | POA: Diagnosis not present

## 2021-03-19 DIAGNOSIS — E039 Hypothyroidism, unspecified: Secondary | ICD-10-CM | POA: Diagnosis not present

## 2021-03-19 DIAGNOSIS — E7800 Pure hypercholesterolemia, unspecified: Secondary | ICD-10-CM | POA: Diagnosis not present

## 2021-03-19 DIAGNOSIS — E559 Vitamin D deficiency, unspecified: Secondary | ICD-10-CM | POA: Diagnosis not present

## 2021-03-19 DIAGNOSIS — Z0000 Encounter for general adult medical examination without abnormal findings: Secondary | ICD-10-CM | POA: Diagnosis not present

## 2021-03-23 DIAGNOSIS — E7800 Pure hypercholesterolemia, unspecified: Secondary | ICD-10-CM | POA: Diagnosis not present

## 2021-03-23 DIAGNOSIS — I10 Essential (primary) hypertension: Secondary | ICD-10-CM | POA: Diagnosis not present

## 2021-03-23 DIAGNOSIS — E559 Vitamin D deficiency, unspecified: Secondary | ICD-10-CM | POA: Diagnosis not present

## 2021-03-23 DIAGNOSIS — E039 Hypothyroidism, unspecified: Secondary | ICD-10-CM | POA: Diagnosis not present

## 2021-03-24 ENCOUNTER — Other Ambulatory Visit: Payer: Self-pay | Admitting: Registered Nurse

## 2021-03-24 DIAGNOSIS — Z1231 Encounter for screening mammogram for malignant neoplasm of breast: Secondary | ICD-10-CM

## 2021-04-02 ENCOUNTER — Encounter: Payer: Self-pay | Admitting: Gastroenterology

## 2021-04-08 DIAGNOSIS — M1711 Unilateral primary osteoarthritis, right knee: Secondary | ICD-10-CM | POA: Diagnosis not present

## 2021-05-01 ENCOUNTER — Ambulatory Visit
Admission: RE | Admit: 2021-05-01 | Discharge: 2021-05-01 | Disposition: A | Discharge status: Home / Self Care | Payer: Medicare HMO | Source: Ambulatory Visit | Attending: Registered Nurse | Admitting: Registered Nurse

## 2021-05-01 ENCOUNTER — Other Ambulatory Visit: Payer: Self-pay

## 2021-05-01 DIAGNOSIS — Z1231 Encounter for screening mammogram for malignant neoplasm of breast: Secondary | ICD-10-CM | POA: Diagnosis not present

## 2021-05-11 DIAGNOSIS — Z961 Presence of intraocular lens: Secondary | ICD-10-CM | POA: Diagnosis not present

## 2021-05-12 ENCOUNTER — Ambulatory Visit: Payer: Medicare HMO | Attending: Internal Medicine

## 2021-05-12 ENCOUNTER — Other Ambulatory Visit: Payer: Self-pay

## 2021-05-12 DIAGNOSIS — Z23 Encounter for immunization: Secondary | ICD-10-CM

## 2021-05-12 MED ORDER — PFIZER COVID-19 VAC BIVALENT 30 MCG/0.3ML IM SUSP
INTRAMUSCULAR | 0 refills | Status: DC
  Filled 2021-05-12: qty 0.3, 1d supply, fill #0

## 2021-05-12 NOTE — Progress Notes (Signed)
   Covid-19 Vaccination Clinic  Name:  [name]    MRN: [id number] DOB: Jan 19, 1946  05/12/2021  [name] was observed post Covid-19 immunization for 15 minutes without incident. She was provided with Vaccine Information Sheet and instruction to access the V-Safe system.   [name] was instructed to call 911 with any severe reactions post vaccine: Difficulty breathing  Swelling of face and throat  A fast heartbeat  A bad rash all over body  Dizziness and weakness   Lu Duffel, PharmD, MBA Clinical Acute Care Pharmacist

## 2021-05-25 DIAGNOSIS — E7800 Pure hypercholesterolemia, unspecified: Secondary | ICD-10-CM | POA: Diagnosis not present

## 2021-05-25 DIAGNOSIS — E039 Hypothyroidism, unspecified: Secondary | ICD-10-CM | POA: Diagnosis not present

## 2021-05-25 DIAGNOSIS — I10 Essential (primary) hypertension: Secondary | ICD-10-CM | POA: Diagnosis not present

## 2021-06-05 DIAGNOSIS — M1711 Unilateral primary osteoarthritis, right knee: Secondary | ICD-10-CM | POA: Diagnosis not present

## 2021-06-17 DIAGNOSIS — M1711 Unilateral primary osteoarthritis, right knee: Secondary | ICD-10-CM | POA: Diagnosis not present

## 2021-06-25 DIAGNOSIS — M1711 Unilateral primary osteoarthritis, right knee: Secondary | ICD-10-CM | POA: Diagnosis not present

## 2021-07-01 DIAGNOSIS — M1711 Unilateral primary osteoarthritis, right knee: Secondary | ICD-10-CM | POA: Diagnosis not present

## 2021-07-06 ENCOUNTER — Encounter: Payer: Self-pay | Admitting: Gastroenterology

## 2021-08-12 ENCOUNTER — Ambulatory Visit (AMBULATORY_SURGERY_CENTER): Payer: Medicare HMO | Admitting: *Deleted

## 2021-08-12 ENCOUNTER — Other Ambulatory Visit: Payer: Self-pay

## 2021-08-12 VITALS — Ht 66.0 in | Wt 190.0 lb

## 2021-08-12 DIAGNOSIS — Z86010 Personal history of colonic polyps: Secondary | ICD-10-CM

## 2021-08-12 NOTE — Progress Notes (Signed)
No egg or soy allergy known to patient  No issues known to pt with past sedation with any surgeries or procedures Patient denies ever being told they had issues or difficulty with intubation  No FH of Malignant Hyperthermia Pt is not on diet pills Pt is not on  home 02  Pt is not on blood thinners  Pt denies issues with constipation  No  A flutter. Pt. Has h/o a fib 15-20 years ago  Pt is fully vaccinated  for Covid     Due to the COVID-19 pandemic we are asking patients to follow certain guidelines in PV and the [place]   Pt aware of COVID protocols and [facility] guidelines   PV completed over the phone. Pt verified name, DOB, address and insurance during PV today.  Pt mailed instruction packet with copy of consent form to read and not return, and instructions.  Pt encouraged to call with questions or issues.  If pt has My chart, procedure instructions sent via My Chart    Sample sheet of over the counter items to purchase sent.

## 2021-08-20 ENCOUNTER — Encounter: Payer: Self-pay | Admitting: Gastroenterology

## 2021-08-26 ENCOUNTER — Ambulatory Visit (AMBULATORY_SURGERY_CENTER): Payer: Medicare HMO | Admitting: Gastroenterology

## 2021-08-26 ENCOUNTER — Encounter: Payer: Self-pay | Admitting: Gastroenterology

## 2021-08-26 ENCOUNTER — Other Ambulatory Visit: Payer: Self-pay

## 2021-08-26 VITALS — BP 138/71 | HR 78 | Temp 97.1°F | Resp 12 | Ht 66.0 in | Wt 190.0 lb

## 2021-08-26 DIAGNOSIS — D123 Benign neoplasm of transverse colon: Secondary | ICD-10-CM | POA: Diagnosis not present

## 2021-08-26 DIAGNOSIS — Z86010 Personal history of colonic polyps: Secondary | ICD-10-CM

## 2021-08-26 DIAGNOSIS — D122 Benign neoplasm of ascending colon: Secondary | ICD-10-CM | POA: Diagnosis not present

## 2021-08-26 MED ORDER — SODIUM CHLORIDE 0.9 % IV SOLN: 500.0000 mL | Freq: Once | INTRAVENOUS | Status: DC

## 2021-08-26 NOTE — Patient Instructions (Signed)
Information on polyps and diverticulosis given to you today.  Await pathology results.  Eat a high fiber diet and resume previous medications.   Avoid NSAIDS (Aspirin, Ibuprofen, Aleve, Naproxen), you may use Tylenol as needed.   YOU HAD AN ENDOSCOPIC PROCEDURE TODAY AT [facility]:   Refer to the procedure report that was given to you for any specific questions about what was found during the examination.  If the procedure report does not answer your questions, please call your gastroenterologist to clarify.  If you requested that your care partner not be given the details of your procedure findings, then the procedure report has been included in a sealed envelope for you to review at your convenience later.  YOU SHOULD EXPECT: Some feelings of bloating in the abdomen. Passage of more gas than usual.  Walking can help get rid of the air that was put into your GI tract during the procedure and reduce the bloating. If you had a lower endoscopy (such as a colonoscopy or flexible sigmoidoscopy) you may notice spotting of blood in your stool or on the toilet paper. If you underwent a bowel prep for your procedure, you may not have a normal bowel movement for a few days.  Please Note:  You might notice some irritation and congestion in your nose or some drainage.  This is from the oxygen used during your procedure.  There is no need for concern and it should clear up in a day or so.  SYMPTOMS TO REPORT IMMEDIATELY:  Following lower endoscopy (colonoscopy or flexible sigmoidoscopy):  Excessive amounts of blood in the stool  Significant tenderness or worsening of abdominal pains  Swelling of the abdomen that is new, acute  Fever of 100F or higher   For urgent or emergent issues, a gastroenterologist can be reached at any hour by calling [phone]. Do not use MyChart messaging for urgent concerns.    DIET:  We do recommend a small meal at first, but then you may proceed to  your regular diet.  Drink plenty of fluids but you should avoid alcoholic beverages for 24 hours.  ACTIVITY:  You should plan to take it easy for the rest of today and you should NOT DRIVE or use heavy machinery until tomorrow (because of the sedation medicines used during the test).    FOLLOW UP: Our staff will call the number listed on your records 48-72 hours following your procedure to check on you and address any questions or concerns that you may have regarding the information given to you following your procedure. If we do not reach you, we will leave a message.  We will attempt to reach you two times.  During this call, we will ask if you have developed any symptoms of COVID 19. If you develop any symptoms (ie: fever, flu-like symptoms, shortness of breath, cough etc.) before then, please call [phone].  If you test positive for Covid 19 in the 2 weeks post procedure, please call and report this information to [place].    If any biopsies were taken you will be contacted by phone or by letter within the next 1-3 weeks.  Please call us at [phone] if you have not heard about the biopsies in 3 weeks.    SIGNATURES/CONFIDENTIALITY: You and/or your care partner have signed paperwork which will be entered into your electronic medical record.  These signatures attest to the fact that that the information above on your After Visit Summary has been  reviewed and is understood.  Full responsibility of the confidentiality of this discharge information lies with you and/or your care-partner.

## 2021-08-26 NOTE — Progress Notes (Signed)
History & Physical  Primary Care Physician:  Jani Gravel, MD Primary Gastroenterologist: Lucio Edward, MD  CHIEF COMPLAINT:  Personal history of colon polyps   HPI: [name] is a 76 y.o. female with a personal history of adenomatous colon polyps for surveillance colonoscopy.   Past Medical History:  Diagnosis Date   Cataract    bilateral -removed   History of atrial fibrillation    "15-20 years ago"   Hyperlipidemia    Hypertension    Skin cancer    melanoma and basal cell and squamous cell   Thyroid disease    doesnt have thyroid    Past Surgical History:  Procedure Laterality Date   ABDOMINAL HYSTERECTOMY     has ovaries   CATARACT EXTRACTION, BILATERAL     COLONOSCOPY     DILATION AND CURETTAGE OF UTERUS     FOOT MASS EXCISION     MELANOMA EXCISION     POLYPECTOMY      Prior to Admission medications   Medication Sig Start Date End Date Taking? Authorizing Provider  atorvastatin (LIPITOR) 40 MG tablet Take 20 mg by mouth daily.   Yes [provider]  calcium carbonate (TUMS EX) 750 MG chewable tablet Chew 1 tablet by mouth daily. Ultra Chewable 400 mg.  Takes 2 daily.   Yes [provider]  cholecalciferol (VITAMIN D) 1000 units tablet Take 1,000 Units by mouth daily.   Yes [provider]  co-enzyme Q-10 30 MG capsule Take 200 mg by mouth daily.   Yes [provider]  levothyroxine (SYNTHROID, LEVOTHROID) 100 MCG tablet Take 88 mcg by mouth daily before breakfast.   Yes [provider]  losartan-hydrochlorothiazide (HYZAAR) 50-12.5 MG tablet Take 1 tablet by mouth daily.   Yes [provider]  Multiple Vitamin (MULTIVITAMIN) tablet Take 1 tablet by mouth daily. Nature Adult   Yes [provider]  celecoxib (CELEBREX) 200 MG capsule Take 200 mg by mouth as needed. 07/28/21   [provider]    Current Outpatient Medications  Medication Sig Dispense Refill   atorvastatin (LIPITOR) 40 MG  tablet Take 20 mg by mouth daily.     calcium carbonate (TUMS EX) 750 MG chewable tablet Chew 1 tablet by mouth daily. Ultra Chewable 400 mg.  Takes 2 daily.     cholecalciferol (VITAMIN D) 1000 units tablet Take 1,000 Units by mouth daily.     co-enzyme Q-10 30 MG capsule Take 200 mg by mouth daily.     levothyroxine (SYNTHROID, LEVOTHROID) 100 MCG tablet Take 88 mcg by mouth daily before breakfast.     losartan-hydrochlorothiazide (HYZAAR) 50-12.5 MG tablet Take 1 tablet by mouth daily.     Multiple Vitamin (MULTIVITAMIN) tablet Take 1 tablet by mouth daily. Nature Adult     celecoxib (CELEBREX) 200 MG capsule Take 200 mg by mouth as needed.     Current Facility-Administered Medications  Medication Dose Route Frequency Provider Last Rate Last Admin   0.9 %  sodium chloride infusion  500 mL Intravenous Once Ladene Artist, MD        Allergies as of 08/26/2021 - Review Complete 08/26/2021  Allergen Reaction Noted   Chloramphenicol      Family History  Problem Relation Age of Onset   Colon cancer Paternal Grandfather    Esophageal cancer Neg Hx    Rectal cancer Neg Hx    Stomach cancer Neg Hx     Social History   Socioeconomic History  Marital status: Married    Spouse name: Not on file   Number of children: Not on file   Years of education: Not on file   Highest education level: Not on file  Occupational History   Not on file  Tobacco Use   Smoking status: Former   Smokeless tobacco: Never   Tobacco comments:    quit 20 yrs ago  Vaping Use   Vaping Use: Never used  Substance and Sexual Activity   Alcohol use: Yes    Alcohol/week: 0.0 standard drinks    Comment: once monthly MAYBE   Drug use: No   Sexual activity: Not on file  Other Topics Concern   Not on file  Social History Narrative   Not on file   Social Determinants of Health   Financial Resource Strain: Not on file  Food Insecurity: Not on file  Transportation Needs: Not on file  Physical Activity:  Not on file  Stress: Not on file  Social Connections: Not on file  Intimate Partner Violence: Not on file    Review of Systems:  All systems reviewed an negative except where noted in HPI.  Gen: Denies any fever, chills, sweats, anorexia, fatigue, weakness, malaise, weight loss, and sleep disorder CV: Denies chest pain, angina, palpitations, syncope, orthopnea, PND, peripheral edema, and claudication. Resp: Denies dyspnea at rest, dyspnea with exercise, cough, sputum, wheezing, coughing up blood, and pleurisy. GI: Denies vomiting blood, jaundice, and fecal incontinence.   Denies dysphagia or odynophagia. GU : Denies urinary burning, blood in urine, urinary frequency, urinary hesitancy, nocturnal urination, and urinary incontinence. MS: Denies joint pain, limitation of movement, and swelling, stiffness, low back pain, extremity pain. Denies muscle weakness, cramps, atrophy.  Derm: Denies rash, itching, dry skin, hives, moles, warts, or unhealing ulcers.  Psych: Denies depression, anxiety, memory loss, suicidal ideation, hallucinations, paranoia, and confusion. Heme: Denies bruising, bleeding, and enlarged lymph nodes. Neuro:  Denies any headaches, dizziness, paresthesias. Endo:  Denies any problems with DM, thyroid, adrenal function.   Physical Exam: General:  Alert, well-developed, in NAD Head:  Normocephalic and atraumatic. Eyes:  Sclera clear, no icterus.   Conjunctiva pink. Ears:  Normal auditory acuity. Mouth:  No deformity or lesions.  Neck:  Supple; no masses . Lungs:  Clear throughout to auscultation.   No wheezes, crackles, or rhonchi. No acute distress. Heart:  Regular rate and rhythm; no murmurs. Abdomen:  Soft, nondistended, nontender. No masses, hepatomegaly. No obvious masses.  Normal bowel .    Rectal:  Deferred   Msk:  Symmetrical without gross deformities.. Pulses:  Normal pulses noted. Extremities:  Without edema. Neurologic:  Alert and  oriented x4;  grossly  normal neurologically. Skin:  Intact without significant lesions or rashes. Cervical Nodes:  No significant cervical adenopathy. Psych:  Alert and cooperative. Normal mood and affect.   Impression / Plan:   Personal history of adenomatous colon polyps for surveillance colonoscopy.   Pricilla Riffle. Fuller Plan  08/26/2021, 1:40 PM See Shea Evans, [facility], to contact our on call provider

## 2021-08-26 NOTE — Op Note (Signed)
[place] Patient Name: [name] Procedure Date: 08/26/2021 1:35 PM MRN: [id number] Endoscopist: Ladene Artist , MD Age: 76 Referring MD:  Date of Birth: 1945-11-23 Gender: Female Account #: [id number] Procedure:                Colonoscopy Indications:              Surveillance: Personal history of adenomatous                            polyps on last colonoscopy > 5 years ago Medicines:                Monitored Anesthesia Care Procedure:                Pre-Anesthesia Assessment:                           - Prior to the procedure, a History and Physical                            was performed, and patient medications and                            allergies were reviewed. The patient's tolerance of                            previous anesthesia was also reviewed. The risks                            and benefits of the procedure and the sedation                            options and risks were discussed with the patient.                            All questions were answered, and informed consent                            was obtained. Prior Anticoagulants: The patient has                            taken no previous anticoagulant or antiplatelet                            agents. ASA Grade Assessment: II - A patient with                            mild systemic disease. After reviewing the risks                            and benefits, the patient was deemed in                            satisfactory condition to undergo the procedure.  After obtaining informed consent, the colonoscope                            was passed under direct vision. Throughout the                            procedure, the patient's blood pressure, pulse, and                            oxygen saturations were monitored continuously. The                            PCF-HQ190L Colonoscope was introduced through the                            anus and advanced to the  the cecum, identified by                            appendiceal orifice and ileocecal valve. The                            ileocecal valve, appendiceal orifice, and rectum                            were photographed. The quality of the bowel                            preparation was excellent. The colonoscopy was                            performed without difficulty. The patient tolerated                            the procedure well. Scope In: 1:43:12 PM Scope Out: 1:59:31 PM Scope Withdrawal Time: 0 hours 12 minutes 53 seconds  Total Procedure Duration: 0 hours 16 minutes 19 seconds  Findings:                 The perianal and digital rectal examinations were                            normal.                           A 15 mm polyp was found in the ascending colon. The                            polyp was sessile. The polyp was removed with a                            cold snare. Resection and retrieval were complete.                           A 6 mm polyp was found in the transverse colon. The  polyp was sessile. The polyp was removed with a                            cold snare. Resection and retrieval were complete.                           A few medium-mouthed diverticula were found in the                            left colon. There was no evidence of diverticular                            bleeding.                           The exam was otherwise without abnormality on                            direct and retroflexion views. Complications:            No immediate complications. Estimated blood loss:                            None. Estimated Blood Loss:     Estimated blood loss: none. Impression:               - One 15 mm polyp in the ascending colon, removed                            with a cold snare. Resected and retrieved.                           - One 6 mm polyp in the transverse colon, removed                            with a cold  snare. Resected and retrieved.                           - Mild diverticulosis in the left colon.                           - The examination was otherwise normal on direct                            and retroflexion views. Recommendation:           - Repeat colonoscopy after studies are complete vs                            no repeat due to age for surveillance based on                            pathology results.                           - Patient  has a contact number available for                            emergencies. The signs and symptoms of potential                            delayed complications were discussed with the                            patient. Return to normal activities tomorrow.                            Written discharge instructions were provided to the                            patient.                           - High fiber diet.                           - Continue present medications.                           - Await pathology results.                           - No aspirin, ibuprofen, naproxen, or other                            non-steroidal anti-inflammatory drugs for 2 weeks                            after polyp removal. Ladene Artist, MD 08/26/2021 2:06:01 PM This report has been signed electronically.

## 2021-08-26 NOTE — Progress Notes (Signed)
Pt's states no medical or surgical changes since previsit or office visit. VS by AS.

## 2021-08-26 NOTE — Progress Notes (Signed)
VSS, transported to PACU °

## 2021-08-26 NOTE — Progress Notes (Signed)
Called to room to assist during endoscopic procedure.  Patient ID and intended procedure confirmed with present staff. Received instructions for my participation in the procedure from the performing physician.  

## 2021-08-28 ENCOUNTER — Telehealth: Payer: Self-pay

## 2021-08-28 ENCOUNTER — Telehealth: Payer: Self-pay | Admitting: *Deleted

## 2021-08-28 NOTE — Telephone Encounter (Signed)
No answer, unable to leave a message, B.Theresa Wedel RN. 

## 2021-08-28 NOTE — Telephone Encounter (Signed)
No answer for second post procedure call back. Left VM.

## 2021-09-02 DIAGNOSIS — M1711 Unilateral primary osteoarthritis, right knee: Secondary | ICD-10-CM | POA: Diagnosis not present

## 2021-09-09 ENCOUNTER — Encounter: Payer: Self-pay | Admitting: Gastroenterology

## 2021-09-21 DIAGNOSIS — Z85828 Personal history of other malignant neoplasm of skin: Secondary | ICD-10-CM | POA: Diagnosis not present

## 2021-09-21 DIAGNOSIS — Z0000 Encounter for general adult medical examination without abnormal findings: Secondary | ICD-10-CM | POA: Diagnosis not present

## 2021-09-21 DIAGNOSIS — L853 Xerosis cutis: Secondary | ICD-10-CM | POA: Diagnosis not present

## 2021-09-21 DIAGNOSIS — D485 Neoplasm of uncertain behavior of skin: Secondary | ICD-10-CM | POA: Diagnosis not present

## 2021-09-21 DIAGNOSIS — Z85820 Personal history of malignant melanoma of skin: Secondary | ICD-10-CM | POA: Diagnosis not present

## 2021-09-21 DIAGNOSIS — L821 Other seborrheic keratosis: Secondary | ICD-10-CM | POA: Diagnosis not present

## 2021-09-21 DIAGNOSIS — C44619 Basal cell carcinoma of skin of left upper limb, including shoulder: Secondary | ICD-10-CM | POA: Diagnosis not present

## 2021-09-21 DIAGNOSIS — D1801 Hemangioma of skin and subcutaneous tissue: Secondary | ICD-10-CM | POA: Diagnosis not present

## 2021-09-21 DIAGNOSIS — L814 Other melanin hyperpigmentation: Secondary | ICD-10-CM | POA: Diagnosis not present

## 2021-09-21 DIAGNOSIS — E039 Hypothyroidism, unspecified: Secondary | ICD-10-CM | POA: Diagnosis not present

## 2021-09-21 DIAGNOSIS — E7800 Pure hypercholesterolemia, unspecified: Secondary | ICD-10-CM | POA: Diagnosis not present

## 2021-09-21 DIAGNOSIS — D2262 Melanocytic nevi of left upper limb, including shoulder: Secondary | ICD-10-CM | POA: Diagnosis not present

## 2021-09-24 DIAGNOSIS — E7800 Pure hypercholesterolemia, unspecified: Secondary | ICD-10-CM | POA: Diagnosis not present

## 2021-09-24 DIAGNOSIS — Z0000 Encounter for general adult medical examination without abnormal findings: Secondary | ICD-10-CM | POA: Diagnosis not present

## 2021-09-24 DIAGNOSIS — I10 Essential (primary) hypertension: Secondary | ICD-10-CM | POA: Diagnosis not present

## 2021-09-24 DIAGNOSIS — Z01818 Encounter for other preprocedural examination: Secondary | ICD-10-CM | POA: Diagnosis not present

## 2021-09-24 DIAGNOSIS — E039 Hypothyroidism, unspecified: Secondary | ICD-10-CM | POA: Diagnosis not present

## 2021-09-24 DIAGNOSIS — Z01419 Encounter for gynecological examination (general) (routine) without abnormal findings: Secondary | ICD-10-CM | POA: Diagnosis not present

## 2021-10-07 DIAGNOSIS — C4441 Basal cell carcinoma of skin of scalp and neck: Secondary | ICD-10-CM | POA: Diagnosis not present

## 2021-11-02 DIAGNOSIS — M1711 Unilateral primary osteoarthritis, right knee: Secondary | ICD-10-CM | POA: Diagnosis not present

## 2021-11-19 DIAGNOSIS — M1711 Unilateral primary osteoarthritis, right knee: Secondary | ICD-10-CM | POA: Diagnosis not present

## 2021-11-19 DIAGNOSIS — G8918 Other acute postprocedural pain: Secondary | ICD-10-CM | POA: Diagnosis not present

## 2021-11-23 DIAGNOSIS — M25561 Pain in right knee: Secondary | ICD-10-CM | POA: Diagnosis not present

## 2021-11-23 DIAGNOSIS — R2689 Other abnormalities of gait and mobility: Secondary | ICD-10-CM | POA: Diagnosis not present

## 2021-11-25 DIAGNOSIS — R2689 Other abnormalities of gait and mobility: Secondary | ICD-10-CM | POA: Diagnosis not present

## 2021-11-25 DIAGNOSIS — M25561 Pain in right knee: Secondary | ICD-10-CM | POA: Diagnosis not present

## 2021-11-30 DIAGNOSIS — M25561 Pain in right knee: Secondary | ICD-10-CM | POA: Diagnosis not present

## 2021-11-30 DIAGNOSIS — R2689 Other abnormalities of gait and mobility: Secondary | ICD-10-CM | POA: Diagnosis not present

## 2021-12-02 DIAGNOSIS — M1711 Unilateral primary osteoarthritis, right knee: Secondary | ICD-10-CM | POA: Diagnosis not present

## 2021-12-03 DIAGNOSIS — M25561 Pain in right knee: Secondary | ICD-10-CM | POA: Diagnosis not present

## 2021-12-03 DIAGNOSIS — R2689 Other abnormalities of gait and mobility: Secondary | ICD-10-CM | POA: Diagnosis not present

## 2021-12-07 DIAGNOSIS — M25561 Pain in right knee: Secondary | ICD-10-CM | POA: Diagnosis not present

## 2021-12-07 DIAGNOSIS — R2689 Other abnormalities of gait and mobility: Secondary | ICD-10-CM | POA: Diagnosis not present

## 2021-12-10 DIAGNOSIS — M25561 Pain in right knee: Secondary | ICD-10-CM | POA: Diagnosis not present

## 2021-12-10 DIAGNOSIS — R2689 Other abnormalities of gait and mobility: Secondary | ICD-10-CM | POA: Diagnosis not present

## 2021-12-14 DIAGNOSIS — R2689 Other abnormalities of gait and mobility: Secondary | ICD-10-CM | POA: Diagnosis not present

## 2021-12-14 DIAGNOSIS — M25561 Pain in right knee: Secondary | ICD-10-CM | POA: Diagnosis not present

## 2021-12-17 DIAGNOSIS — M25561 Pain in right knee: Secondary | ICD-10-CM | POA: Diagnosis not present

## 2021-12-17 DIAGNOSIS — R2689 Other abnormalities of gait and mobility: Secondary | ICD-10-CM | POA: Diagnosis not present

## 2021-12-22 DIAGNOSIS — M25561 Pain in right knee: Secondary | ICD-10-CM | POA: Diagnosis not present

## 2021-12-22 DIAGNOSIS — R2689 Other abnormalities of gait and mobility: Secondary | ICD-10-CM | POA: Diagnosis not present

## 2021-12-30 DIAGNOSIS — M1711 Unilateral primary osteoarthritis, right knee: Secondary | ICD-10-CM | POA: Diagnosis not present

## 2022-03-18 ENCOUNTER — Other Ambulatory Visit: Payer: Self-pay | Admitting: Registered Nurse

## 2022-03-18 DIAGNOSIS — Z1231 Encounter for screening mammogram for malignant neoplasm of breast: Secondary | ICD-10-CM

## 2022-03-22 DIAGNOSIS — R2232 Localized swelling, mass and lump, left upper limb: Secondary | ICD-10-CM | POA: Diagnosis not present

## 2022-04-12 ENCOUNTER — Other Ambulatory Visit: Payer: Self-pay | Admitting: Orthopedic Surgery

## 2022-04-12 DIAGNOSIS — R2232 Localized swelling, mass and lump, left upper limb: Secondary | ICD-10-CM

## 2022-04-14 ENCOUNTER — Ambulatory Visit
Admission: RE | Admit: 2022-04-14 | Discharge: 2022-04-14 | Disposition: A | Discharge status: Home / Self Care | Payer: Medicare HMO | Source: Ambulatory Visit | Attending: Orthopedic Surgery | Admitting: Orthopedic Surgery

## 2022-04-14 DIAGNOSIS — R2232 Localized swelling, mass and lump, left upper limb: Secondary | ICD-10-CM | POA: Diagnosis not present

## 2022-04-26 DIAGNOSIS — M65832 Other synovitis and tenosynovitis, left forearm: Secondary | ICD-10-CM | POA: Diagnosis not present

## 2022-05-11 ENCOUNTER — Ambulatory Visit
Admission: RE | Admit: 2022-05-11 | Discharge: 2022-05-11 | Disposition: A | Discharge status: Home / Self Care | Payer: Medicare HMO | Source: Ambulatory Visit | Attending: Registered Nurse | Admitting: Registered Nurse

## 2022-05-11 DIAGNOSIS — Z1231 Encounter for screening mammogram for malignant neoplasm of breast: Secondary | ICD-10-CM | POA: Diagnosis not present

## 2022-05-27 DIAGNOSIS — H43813 Vitreous degeneration, bilateral: Secondary | ICD-10-CM | POA: Diagnosis not present

## 2022-05-30 ENCOUNTER — Emergency Department (HOSPITAL_COMMUNITY): Payer: Medicare HMO

## 2022-05-30 ENCOUNTER — Other Ambulatory Visit: Payer: Self-pay

## 2022-05-30 ENCOUNTER — Emergency Department (HOSPITAL_COMMUNITY)
Admission: EM | Admit: 2022-05-30 | Discharge: 2022-05-30 | Disposition: A | Discharge status: Home / Self Care | Payer: Medicare HMO | Attending: Emergency Medicine | Admitting: Emergency Medicine

## 2022-05-30 DIAGNOSIS — Z87891 Personal history of nicotine dependence: Secondary | ICD-10-CM | POA: Insufficient documentation

## 2022-05-30 DIAGNOSIS — Z85828 Personal history of other malignant neoplasm of skin: Secondary | ICD-10-CM | POA: Diagnosis not present

## 2022-05-30 DIAGNOSIS — I10 Essential (primary) hypertension: Secondary | ICD-10-CM | POA: Diagnosis not present

## 2022-05-30 DIAGNOSIS — Z7901 Long term (current) use of anticoagulants: Secondary | ICD-10-CM | POA: Diagnosis not present

## 2022-05-30 DIAGNOSIS — R059 Cough, unspecified: Secondary | ICD-10-CM | POA: Diagnosis not present

## 2022-05-30 DIAGNOSIS — R009 Unspecified abnormalities of heart beat: Secondary | ICD-10-CM | POA: Diagnosis present

## 2022-05-30 DIAGNOSIS — I480 Paroxysmal atrial fibrillation: Secondary | ICD-10-CM | POA: Insufficient documentation

## 2022-05-30 DIAGNOSIS — R002 Palpitations: Secondary | ICD-10-CM | POA: Diagnosis not present

## 2022-05-30 DIAGNOSIS — Z79899 Other long term (current) drug therapy: Secondary | ICD-10-CM | POA: Insufficient documentation

## 2022-05-30 LAB — BASIC METABOLIC PANEL
Anion gap: 12 (ref 5–15)
BUN: 9 mg/dL (ref 8–23)
CO2: 28 mmol/L (ref 22–32)
Calcium: 8.9 mg/dL (ref 8.9–10.3)
Chloride: 98 mmol/L (ref 98–111)
Creatinine, Ser: 1.02 mg/dL — ABNORMAL HIGH (ref 0.44–1.00)
GFR, Estimated: 56 mL/min — ABNORMAL LOW (ref >60)
Glucose, Bld: 145 mg/dL — ABNORMAL HIGH (ref 70–99)
Potassium: 3.1 mmol/L — ABNORMAL LOW (ref 3.5–5.1)
Sodium: 138 mmol/L (ref 135–145)

## 2022-05-30 LAB — CBC
HCT: 44.8 % (ref 36.0–46.0)
Hemoglobin: 15 g/dL (ref 12.0–15.0)
MCH: 31.8 pg (ref 26.0–34.0)
MCHC: 33.5 g/dL (ref 30.0–36.0)
MCV: 94.9 fL (ref 80.0–100.0)
Platelets: 350 10*3/uL (ref 150–400)
RBC: 4.72 MIL/uL (ref 3.87–5.11)
RDW: 12.7 % (ref 11.5–15.5)
WBC: 12.5 10*3/uL — ABNORMAL HIGH (ref 4.0–10.5)
nRBC: 0 % (ref 0.0–0.2)

## 2022-05-30 LAB — TROPONIN I (HIGH SENSITIVITY)
Troponin I (High Sensitivity): 5 ng/L (ref <18)
Troponin I (High Sensitivity): 7 ng/L (ref <18)

## 2022-05-30 LAB — TSH: TSH: 1.129 u[IU]/mL (ref 0.350–4.500)

## 2022-05-30 LAB — MAGNESIUM: Magnesium: 2.1 mg/dL (ref 1.7–2.4)

## 2022-05-30 MED ORDER — APIXABAN (ELIQUIS) EDUCATION KIT FOR DVT/PE PATIENTS: PACK | Freq: Once | Status: AC

## 2022-05-30 MED ORDER — POTASSIUM CHLORIDE CRYS ER 20 MEQ PO TBCR
40.0000 meq | EXTENDED_RELEASE_TABLET | Freq: Once | ORAL | Status: AC
  Administered 2022-05-30: 40 meq via ORAL
  Filled 2022-05-30: qty 2

## 2022-05-30 MED ORDER — APIXABAN 5 MG PO TABS: 5.0000 mg | ORAL_TABLET | Freq: Two times a day (BID) | ORAL | 0 refills | Status: DC

## 2022-05-30 NOTE — ED Provider Triage Note (Signed)
Emergency Medicine Provider Triage Evaluation Note  [name] , a 76 y.o. female  was evaluated in triage.  Pt complains of palpitations, irregular heartbeat.  States woke up around midnight with coughing episode, after coughing hard, felt palpitations, irregular heartbeat.  History of A-fib and possibly 2006, not anticoagulated currently.. See downtime sheet Review of Systems  Positive:  Negative:   Physical Exam  BP (!) 139/92 (BP Location: Left Arm)   Pulse (!) 115   Temp 98.5 F (36.9 C)   Resp 18   SpO2 95%  Gen:   Awake, no distress   Resp:  Normal effort  MSK:   Moves extremities without difficulty  Other:  Heart rate rapid and irregular  Medical Decision Making  Medically screening exam initiated at 2:55 AM.  Appropriate orders placed.  [name] was informed that the remainder of the evaluation will be completed by another provider, this initial triage assessment does not replace that evaluation, and the importance of remaining in the ED until their evaluation is complete.     Tacy Learn, PA-C 05/30/22 [phone]

## 2022-05-30 NOTE — ED Triage Notes (Signed)
See downtime sheets. Pt here with irregular heart beat and history of afib.

## 2022-05-30 NOTE — ED Provider Notes (Signed)
[facility] EMERGENCY DEPARTMENT Provider Note  CSN: [id number] Arrival date & time: 05/30/22 4650  Chief Complaint(s) Irregular Heart Beat  HPI [name] is a 76 y.o. female history of hypertension, hyperlipidemia presenting to the emergency department with rapid heartbeat.  Patient reports that it began last night, associated with lightheadedness.  Denies chest pain, nausea, shortness of breath.  Symptoms have currently resolved.  No fainting.  Reports possibly 1 distant episode.  No numbness, tingling, weakness, abdominal pain, diarrhea.  No dysuria.  No fevers or chills   Past Medical History Past Medical History:  Diagnosis Date   Cataract    bilateral -removed   History of atrial fibrillation    "15-20 years ago"   Hyperlipidemia    Hypertension    Skin cancer    melanoma and basal cell and squamous cell   Thyroid disease    doesnt have thyroid   Patient Active Problem List   Diagnosis Date Noted   PERSONAL HX COLONIC POLYPS 08/10/2010   Home Medication(s) Prior to Admission medications   Medication Sig Start Date End Date Taking? Authorizing Provider  apixaban (ELIQUIS) 5 MG TABS tablet Take 1 tablet (5 mg total) by mouth 2 (two) times daily. 05/30/22 06/29/22 Yes Cristie Hem, MD  atorvastatin (LIPITOR) 40 MG tablet Take 20 mg by mouth daily.    [provider]  calcium carbonate (TUMS EX) 750 MG chewable tablet Chew 1 tablet by mouth daily. Ultra Chewable 400 mg.  Takes 2 daily.    [provider]  celecoxib (CELEBREX) 200 MG capsule Take 200 mg by mouth as needed. 07/28/21   [provider]  cholecalciferol (VITAMIN D) 1000 units tablet Take 1,000 Units by mouth daily.    [provider]  co-enzyme Q-10 30 MG capsule Take 200 mg by mouth daily.    [provider]  levothyroxine (SYNTHROID, LEVOTHROID) 100 MCG tablet Take 88 mcg by mouth daily before breakfast.    [provider]   losartan-hydrochlorothiazide (HYZAAR) 50-12.5 MG tablet Take 1 tablet by mouth daily.    [provider]  Multiple Vitamin (MULTIVITAMIN) tablet Take 1 tablet by mouth daily. Nature Adult    [provider]                                                                                                                                    Past Surgical History Past Surgical History:  Procedure Laterality Date   ABDOMINAL HYSTERECTOMY     has ovaries   CATARACT EXTRACTION, BILATERAL     COLONOSCOPY     DILATION AND CURETTAGE OF UTERUS     FOOT MASS EXCISION     MELANOMA EXCISION     POLYPECTOMY     Family History Family History  Problem Relation Age of Onset   Colon cancer Paternal Grandfather    Esophageal cancer Neg Hx  Rectal cancer Neg Hx    Stomach cancer Neg Hx     Social History Social History   Tobacco Use   Smoking status: Former   Smokeless tobacco: Never   Tobacco comments:    quit 20 yrs ago  Vaping Use   Vaping Use: Never used  Substance Use Topics   Alcohol use: Yes    Alcohol/week: 0.0 standard drinks of alcohol    Comment: once monthly MAYBE   Drug use: No   Allergies Chloramphenicol  Review of Systems Review of Systems  All other systems reviewed and are negative.   Physical Exam Vital Signs  I have reviewed the triage vital signs BP (!) 155/72   Pulse 85   Temp 98.3 F (36.8 C) (Oral)   Resp 16   SpO2 97%  Physical Exam Vitals and nursing note reviewed.  Constitutional:      General: She is not in acute distress.    Appearance: She is well-developed.  HENT:     Head: Normocephalic and atraumatic.     Mouth/Throat:     Mouth: Mucous membranes are moist.  Eyes:     Pupils: Pupils are equal, round, and reactive to light.  Cardiovascular:     Rate and Rhythm: Normal rate and regular rhythm.     Heart sounds: No murmur heard. Pulmonary:     Effort: Pulmonary effort is normal. No respiratory distress.     Breath  sounds: Normal breath sounds.  Abdominal:     General: Abdomen is flat.     Palpations: Abdomen is soft.     Tenderness: There is no abdominal tenderness.  Musculoskeletal:        General: No tenderness.     Right lower leg: No edema.     Left lower leg: No edema.  Skin:    General: Skin is warm and dry.  Neurological:     General: No focal deficit present.     Mental Status: She is alert. Mental status is at baseline.  Psychiatric:        Mood and Affect: Mood normal.        Behavior: Behavior normal.     ED Results and Treatments Labs (all labs ordered are listed, but only abnormal results are displayed) Labs Reviewed  BASIC METABOLIC PANEL - Abnormal; Notable for the following components:      Result Value   Potassium 3.1 (*)    Glucose, Bld 145 (*)    Creatinine, Ser 1.02 (*)    GFR, Estimated 56 (*)    All other components within normal limits  CBC - Abnormal; Notable for the following components:   WBC 12.5 (*)    All other components within normal limits  TSH  MAGNESIUM  TROPONIN I (HIGH SENSITIVITY)  TROPONIN I (HIGH SENSITIVITY)  Radiology DG Chest Port 1 View  Result Date: 05/30/2022 CLINICAL DATA:  Coughing and palpitations.  Irregular heartbeat. EXAM: PORTABLE CHEST 1 VIEW COMPARISON:  09/18/2004. FINDINGS: 0404 hours. The lungs are clear without focal pneumonia, edema, pneumothorax or pleural effusion. The cardiopericardial silhouette is within normal limits for size. The visualized bony structures of the thorax are unremarkable. IMPRESSION: No active disease. Electronically Signed   By: Misty Stanley M.D.   On: 05/30/2022 06:14    Pertinent labs & imaging results that were available during my care of the patient were reviewed by me and considered in my medical decision making (see MDM for details).  Medications Ordered in ED Medications   potassium chloride SA (KLOR-CON M) CR tablet 40 mEq (40 mEq Oral Given 05/30/22 1230)  apixaban (ELIQUIS) Education Kit for DVT/PE patients ( Does not apply Given 05/30/22 1231)                                                                                                                                     Procedures .1-3 Lead EKG Interpretation  Performed by: Cristie Hem, MD Authorized by: Cristie Hem, MD     Interpretation: normal     ECG rate:  85   ECG rate assessment: normal     Rhythm: sinus rhythm     Ectopy: none     Conduction: normal     (including critical care time)  Medical Decision Making / ED Course   MDM:  76 year old female presenting to the emergency department with rapid heartbeat.  Patient well-appearing, physical exam with regular rate, no tachycardia.  Reviewed initial EKG performed at 1:27 AM, which does demonstrate A-fib with RVR at a rate of 158.  Currently rate is reassuring at 85.  She is currently in normal sinus rhythm on repeat EKG.  Her troponin is negative and she has no acute ST or T wave changes.  Her symptoms of felt entirely resolved.  Suspect paroxysmal atrial fibrillation.  We will start on apixaban and schedule urgent cardiology follow-up.  Otherwise, electrolytes notable for mild hypokalemia which we will replete.  Her thyroid function is reassuring.  Pharmacist will educate on apixaban.  Discussed with patient risks of apixaban and she agrees to proceed and understands it will help prevent strokes.  Low concern for underlying triggering process such as infection, thyrotoxicosis, given reassuring symptoms, TSH. Will discharge patient to home. All questions answered. Patient comfortable with plan of discharge. Return precautions discussed with patient and specified on the after visit summary.         Additional history obtained: -Additional history obtained from spouse -External records from outside source obtained and  reviewed including: Chart review including previous notes, labs, imaging, consultation notes including GI visit 08/26/21   Lab Tests: -I ordered, reviewed, and interpreted labs.   The pertinent results include:   Labs Reviewed  BASIC METABOLIC PANEL - Abnormal; Notable  for the following components:      Result Value   Potassium 3.1 (*)    Glucose, Bld 145 (*)    Creatinine, Ser 1.02 (*)    GFR, Estimated 56 (*)    All other components within normal limits  CBC - Abnormal; Notable for the following components:   WBC 12.5 (*)    All other components within normal limits  TSH  MAGNESIUM  TROPONIN I (HIGH SENSITIVITY)  TROPONIN I (HIGH SENSITIVITY)    Notable for hypokalemia, very mild leukocytosis of uncertain cause  EKG   EKG Interpretation  Date/Time:  Sunday May 30 2022 11:21:10 EST Ventricular Rate:  91 PR Interval:  164 QRS Duration: 94 QT Interval:  359 QTC Calculation: 442 R Axis:   74 Text Interpretation: Sinus rhythm Low voltage, precordial leads When compared to previous tracing afib resolved Confirmed by Garnette Gunner [phone]) on 05/30/2022 12:32:35 PM          Medicines ordered and prescription drug management: Meds ordered this encounter  Medications   apixaban (ELIQUIS) 5 MG TABS tablet    Sig: Take 1 tablet (5 mg total) by mouth 2 (two) times daily.    Dispense:  60 tablet    Refill:  0   potassium chloride SA (KLOR-CON M) CR tablet 40 mEq   apixaban (ELIQUIS) Education Kit for DVT/PE patients    -I have reviewed the patients home medicines and have made adjustments as needed  Cardiac Monitoring: The patient was maintained on a cardiac monitor.  I personally viewed and interpreted the cardiac monitored which showed an underlying rhythm of: normal sinus rhythm   Social Determinants of Health:  Diagnosis or treatment significantly limited by social determinants of health: former smoker   Reevaluation: After the interventions noted above, I  reevaluated the patient and found that they have resolved  Co morbidities that complicate the patient evaluation  Past Medical History:  Diagnosis Date   Cataract    bilateral -removed   History of atrial fibrillation    "15-20 years ago"   Hyperlipidemia    Hypertension    Skin cancer    melanoma and basal cell and squamous cell   Thyroid disease    doesnt have thyroid      Dispostion: Disposition decision including need for hospitalization was considered, and patient discharged from emergency department.    Final Clinical Impression(s) / ED Diagnoses Final diagnoses:  Paroxysmal A-fib ([place])     This chart was dictated using voice recognition software.  Despite best efforts to proofread,  errors can occur which can change the documentation meaning.    Cristie Hem, MD 05/30/22 1233

## 2022-05-30 NOTE — Discharge Instructions (Addendum)
We evaluated you for your rapid heartbeat.  Your EKG showed atrial fibrillation, and abnormal heart rhythm.  This resolved on its own.  Atrial fibrillation can increase your risk of stroke, so we are going to start you on a blood thinner.  You need to follow-up closely with cardiology, and we have placed a referral.  They should call you for follow-up this week.  If you experience rapid heartbeat again, please return to the emergency department.  Starting a blood thinner can increase your risk of bleeding.  Please return to the emergency department if you develop blood in your stool, black stools, vomiting blood, severe headaches, head trauma, or any other concerning symptoms.  Please also return to the emergency department if you develop chest pain, difficulty breathing, lightheadedness, fainting, or any other concerning symptoms.

## 2022-05-31 ENCOUNTER — Other Ambulatory Visit: Payer: Self-pay

## 2022-05-31 MED ORDER — COVID-19 MRNA VAC-TRIS(PFIZER) 30 MCG/0.3ML IM SUSY
PREFILLED_SYRINGE | INTRAMUSCULAR | 0 refills | Status: DC
  Filled 2022-05-31: qty 0.3, 1d supply, fill #0

## 2022-06-01 ENCOUNTER — Ambulatory Visit: Payer: Medicare HMO | Attending: Internal Medicine | Admitting: Internal Medicine

## 2022-06-01 ENCOUNTER — Encounter: Payer: Self-pay | Admitting: Internal Medicine

## 2022-06-01 VITALS — BP 138/78 | HR 89 | Ht 66.0 in | Wt 188.0 lb

## 2022-06-01 DIAGNOSIS — I10 Essential (primary) hypertension: Secondary | ICD-10-CM | POA: Diagnosis not present

## 2022-06-01 DIAGNOSIS — I480 Paroxysmal atrial fibrillation: Secondary | ICD-10-CM | POA: Diagnosis not present

## 2022-06-01 DIAGNOSIS — E782 Mixed hyperlipidemia: Secondary | ICD-10-CM | POA: Diagnosis not present

## 2022-06-01 DIAGNOSIS — D6869 Other thrombophilia: Secondary | ICD-10-CM | POA: Insufficient documentation

## 2022-06-01 DIAGNOSIS — E039 Hypothyroidism, unspecified: Secondary | ICD-10-CM | POA: Diagnosis not present

## 2022-06-01 MED ORDER — DILTIAZEM HCL 30 MG PO TABS: 30.0000 mg | ORAL_TABLET | Freq: Four times a day (QID) | ORAL | 11 refills | Status: AC | PRN

## 2022-06-01 MED ORDER — APIXABAN 5 MG PO TABS: 5.0000 mg | ORAL_TABLET | Freq: Two times a day (BID) | ORAL | 3 refills | Status: DC

## 2022-06-01 NOTE — Progress Notes (Signed)
Cardiology Office Note:    Date:  06/01/2022   ID:  [name], DOB 1945-08-22, MRN [id number]  PCP:  Jani Gravel, MD   [facility] Cardiologist:  Werner Lean, MD     Referring MD: Cristie Hem, MD   CC: RVR Consulted for the evaluation of atrial fibrillation at the behest of Dr. Truett Mainland  History of Present Illness:    [name] is a 76 y.o. female with a hx of PAF, HTN, and HLD.  Patient notes that she is feeling OK.   Distantly she had atrial fibrillation diagnosis in 2006.  Back then her heart rate was so loud because that was all she could here She had a cough recently.  COVID-19 negative.  She was recovering from a cold. She had a sudden onset change in her hear rate.  Took her pulse.  Found to be in atrial fibrillation RVR.  She could hear her heart rare. She self converted. Had a normal TSH. She was started on eliquis. Took her first dose on Sunday.  No bleeding issues so far.  Has had no chest pain, chest pressure, chest tightness, chest stinging.   No PND or orthopnea.  No weight gain, leg swelling , or abdominal swelling.  No syncope or near syncope.  Her mother noted she had a history of rheumatic heart disease.  Had a prior cardioversion that went well.   Past Medical History:  Diagnosis Date   Cataract    bilateral -removed   History of atrial fibrillation    "15-20 years ago"   Hyperlipidemia    Hypertension    Skin cancer    melanoma and basal cell and squamous cell   Thyroid disease    doesnt have thyroid    Past Surgical History:  Procedure Laterality Date   ABDOMINAL HYSTERECTOMY     has ovaries   CATARACT EXTRACTION, BILATERAL     COLONOSCOPY     DILATION AND CURETTAGE OF UTERUS     FOOT MASS EXCISION     MELANOMA EXCISION     POLYPECTOMY      Current Medications: Current Meds  Medication Sig   amoxicillin (AMOXIL) 500 MG tablet Take 2,000 mg by mouth as directed. For dental visits only    atorvastatin (LIPITOR) 40 MG tablet Take 20 mg by mouth daily.   calcium carbonate (TUMS EX) 750 MG chewable tablet Chew 1 tablet by mouth daily. Ultra Chewable 400 mg.  Takes 2 daily.   cholecalciferol (VITAMIN D) 1000 units tablet Take 1,000 Units by mouth daily.   co-enzyme Q-10 30 MG capsule Take 200 mg by mouth daily.   diltiazem (CARDIZEM) 30 MG tablet Take 1 tablet (30 mg total) by mouth every 6 (six) hours as needed (palpitations).   hydrochlorothiazide (HYDRODIURIL) 12.5 MG tablet Take 12.5 mg by mouth daily.   levothyroxine (SYNTHROID, LEVOTHROID) 100 MCG tablet Take 88 mcg by mouth daily before breakfast.   losartan (COZAAR) 50 MG tablet Take 25 mg by mouth daily.   Multiple Vitamin (MULTIVITAMIN) tablet Take 1 tablet by mouth daily. Petra Kuba Adult   [DISCONTINUED] apixaban (ELIQUIS) 5 MG TABS tablet Take 1 tablet (5 mg total) by mouth 2 (two) times daily.     Allergies:   Chloramphenicol   Social History   Socioeconomic History   Marital status: Married    Spouse name: Not on file   Number of children: Not on file   Years of education: Not on  file   Highest education level: Not on file  Occupational History   Not on file  Tobacco Use   Smoking status: Former   Smokeless tobacco: Never   Tobacco comments:    quit 20 yrs ago  Vaping Use   Vaping Use: Never used  Substance and Sexual Activity   Alcohol use: Yes    Alcohol/week: 0.0 standard drinks of alcohol    Comment: once monthly MAYBE   Drug use: No   Sexual activity: Not on file  Other Topics Concern   Not on file  Social History Narrative   Not on file   Social Determinants of Health   Financial Resource Strain: Not on file  Food Insecurity: Not on file  Transportation Needs: Not on file  Physical Activity: Not on file  Stress: Not on file  Social Connections: Not on file   Social: former NICU nurse, married; husband sees Dr. Jeffie Pollock and Lovena Le  Family History: The patient's family history includes  Colon cancer in her paternal grandfather. There is no history of Esophageal cancer, Rectal cancer, or Stomach cancer. Family history of AAA.  ROS:   Please see the history of present illness.     All other systems reviewed and are negative.  EKGs/Labs/Other Studies Reviewed:    The following studies were reviewed today:  EKG:  EKG is  ordered today.  The ekg ordered today demonstrates  06/01/22: sinus rhythm 05/31/22: A fib RVR anterior infarct pattern Rate 158    Recent Labs: 05/30/2022: BUN 9; Creatinine, Ser 1.02; Hemoglobin 15.0; Magnesium 2.1; Platelets 350; Potassium 3.1; Sodium 138; TSH 1.129  Recent Lipid Panel No results found for: "CHOL", "TRIG", "HDL", "CHOLHDL", "VLDL", "LDLCALC", "LDLDIRECT"  Risk Assessment/Calculations:    CHA2DS2-VASc Score = 4   This indicates a 4.8% annual risk of stroke. The patient's score is based upon: CHF History: 0 HTN History: 1 Diabetes History: 0 Stroke History: 0 Vascular Disease History: 0 Age Score: 2 Gender Score: 1             Physical Exam:    VS:  BP 138/78   Pulse 89   Ht '5\' 6"'$  (1.676 m)   Wt 188 lb (85.3 kg)   SpO2 94%   BMI 30.34 kg/m     Wt Readings from Last 3 Encounters:  06/01/22 188 lb (85.3 kg)  08/26/21 190 lb (86.2 kg)  08/12/21 190 lb (86.2 kg)    GEN:  Well nourished, well developed in no acute distress HEENT: Normal NECK: No JVD; No carotid bruits LYMPHATICS: No lymphadenopathy CARDIAC: RRR, no murmurs, rubs, gallops RESPIRATORY:  Clear to auscultation without rales, wheezing or rhonchi  ABDOMEN: Soft, non-tender, non-distended MUSCULOSKELETAL:  No edema; No deformity  SKIN: Warm and dry NEUROLOGIC:  Alert and oriented x 3 PSYCHIATRIC:  Normal affect   ASSESSMENT:    1. Paroxysmal atrial fibrillation (HCC)   2. Hypothyroidism, unspecified type   3. Acquired thrombophilia ([place])   4. Mixed hyperlipidemia   5. Essential hypertension    PLAN:    Paroxsymal Atrial  Fibrillation Hypothyroidism - Risk factors include age, BP, and HTN - CHADSVASC=4. - Continue anticoagulation with eliquis; Acquired Thrombophilia - CBC in two weeks - Will get obtain TTE  - no change to her BP meds - will get diltiazem 30 mg PO PRN q6 hr  HLD - at LDL goal< 80 on current statin  Her ABX prior to dental work is per ortho; not per cardiology  Six months  with me or APP       Medication Adjustments/Labs and Tests Ordered: Current medicines are reviewed at length with the patient today.  Concerns regarding medicines are outlined above.  Orders Placed This Encounter  Procedures   CBC   EKG 12-Lead   ECHOCARDIOGRAM COMPLETE   Meds ordered this encounter  Medications   diltiazem (CARDIZEM) 30 MG tablet    Sig: Take 1 tablet (30 mg total) by mouth every 6 (six) hours as needed (palpitations).    Dispense:  30 tablet    Refill:  11   apixaban (ELIQUIS) 5 MG TABS tablet    Sig: Take 1 tablet (5 mg total) by mouth 2 (two) times daily.    Dispense:  180 tablet    Refill:  3    Patient Instructions  Medication Instructions:  Your physician has recommended you make the following change in your medication:  START: diltiazem (Cardizem) 30 mg by mouth as needed every 6 hours for palpitations/ fast heart beat  We have provided you with 30 days of Eliquis samples  *If you need a refill on your cardiac medications before your next appointment, please call your pharmacy*   Lab Work: IN 2 WEEKS: CBC  If you have labs (blood work) drawn today and your tests are completely normal, you will receive your results only by: [place] (if you have MyChart) OR A paper copy in the mail If you have any lab test that is abnormal or we need to change your treatment, we will call you to review the results.   Testing/Procedures: Your physician has requested that you have an echocardiogram. Echocardiography is a painless test that uses sound waves to create images of your  heart. It provides your doctor with information about the size and shape of your heart and how well your heart's chambers and valves are working. This procedure takes approximately one hour. There are no restrictions for this procedure. Please do NOT wear cologne, perfume, aftershave, or lotions (deodorant is allowed). Please arrive 15 minutes prior to your appointment time.    Follow-Up: At [facility], you and your health needs are our priority.  As part of our continuing mission to provide you with exceptional heart care, we have created designated Provider Care Teams.  These Care Teams include your primary Cardiologist (physician) and Advanced Practice Providers (APPs -  Physician Assistants and Nurse Practitioners) who all work together to provide you with the care you need, when you need it.  We recommend signing up for the patient portal called "MyChart".  Sign up information is provided on this After Visit Summary.  MyChart is used to connect with patients for Virtual Visits (Telemedicine).  Patients are able to view lab/test results, encounter notes, upcoming appointments, etc.  Non-urgent messages can be sent to your provider as well.   To learn more about what you can do with MyChart, go to [email or web address].    Your next appointment:   6 month(s)  The format for your next appointment:   In Person  Provider:   Werner Lean, MD      Important Information About Sugar         Signed, Werner Lean, MD  06/01/2022 11:40 AM    [place]

## 2022-06-01 NOTE — Patient Instructions (Signed)
Medication Instructions:  Your physician has recommended you make the following change in your medication:  START: diltiazem (Cardizem) 30 mg by mouth as needed every 6 hours for palpitations/ fast heart beat  We have provided you with 30 days of Eliquis samples  *If you need a refill on your cardiac medications before your next appointment, please call your pharmacy*   Lab Work: IN 2 WEEKS: CBC  If you have labs (blood work) drawn today and your tests are completely normal, you will receive your results only by: [place] (if you have MyChart) OR A paper copy in the mail If you have any lab test that is abnormal or we need to change your treatment, we will call you to review the results.   Testing/Procedures: Your physician has requested that you have an echocardiogram. Echocardiography is a painless test that uses sound waves to create images of your heart. It provides your doctor with information about the size and shape of your heart and how well your heart's chambers and valves are working. This procedure takes approximately one hour. There are no restrictions for this procedure. Please do NOT wear cologne, perfume, aftershave, or lotions (deodorant is allowed). Please arrive 15 minutes prior to your appointment time.    Follow-Up: At [facility], you and your health needs are our priority.  As part of our continuing mission to provide you with exceptional heart care, we have created designated Provider Care Teams.  These Care Teams include your primary Cardiologist (physician) and Advanced Practice Providers (APPs -  Physician Assistants and Nurse Practitioners) who all work together to provide you with the care you need, when you need it.  We recommend signing up for the patient portal called "MyChart".  Sign up information is provided on this After Visit Summary.  MyChart is used to connect with patients for Virtual Visits (Telemedicine).  Patients are able to view  lab/test results, encounter notes, upcoming appointments, etc.  Non-urgent messages can be sent to your provider as well.   To learn more about what you can do with MyChart, go to [email or web address].    Your next appointment:   6 month(s)  The format for your next appointment:   In Person  Provider:   Werner Lean, MD      Important Information About Sugar

## 2022-06-04 ENCOUNTER — Other Ambulatory Visit: Payer: Self-pay

## 2022-06-04 ENCOUNTER — Other Ambulatory Visit (HOSPITAL_BASED_OUTPATIENT_CLINIC_OR_DEPARTMENT_OTHER): Payer: Self-pay

## 2022-06-11 DIAGNOSIS — M65832 Other synovitis and tenosynovitis, left forearm: Secondary | ICD-10-CM | POA: Diagnosis not present

## 2022-06-15 ENCOUNTER — Ambulatory Visit: Payer: Medicare HMO | Attending: Internal Medicine

## 2022-06-15 ENCOUNTER — Ambulatory Visit: Payer: Medicare HMO

## 2022-06-15 DIAGNOSIS — I480 Paroxysmal atrial fibrillation: Secondary | ICD-10-CM | POA: Insufficient documentation

## 2022-06-15 DIAGNOSIS — I10 Essential (primary) hypertension: Secondary | ICD-10-CM | POA: Diagnosis not present

## 2022-06-15 LAB — ECHOCARDIOGRAM COMPLETE
Area-P 1/2: 3.57 cm2
MV M vel: 5.62 m/s
MV Peak grad: 126.3 mmHg
S' Lateral: 2.7 cm

## 2022-06-15 LAB — CBC
Hematocrit: 42.5 % (ref 34.0–46.6)
Hemoglobin: 14.2 g/dL (ref 11.1–15.9)
MCH: 31.3 pg (ref 26.6–33.0)
MCHC: 33.4 g/dL (ref 31.5–35.7)
MCV: 94 fL (ref 79–97)
Platelets: 265 10*3/uL (ref 150–450)
RBC: 4.53 x10E6/uL (ref 3.77–5.28)
RDW: 13.8 % (ref 11.7–15.4)
WBC: 9.5 10*3/uL (ref 3.4–10.8)

## 2022-06-16 ENCOUNTER — Telehealth: Payer: Self-pay

## 2022-06-16 MED ORDER — METOPROLOL SUCCINATE ER 25 MG PO TB24: 25.0000 mg | ORAL_TABLET | Freq: Every day | ORAL | 3 refills | Status: DC

## 2022-06-16 NOTE — Telephone Encounter (Signed)
The patient's husband has been notified of the result and verbalized understanding.  All questions (if any) were answered. Bernestine Amass, RN 06/16/2022 3:15 PM

## 2022-06-16 NOTE — Telephone Encounter (Signed)
-----   Message from Werner Lean, MD sent at 06/16/2022  3:08 PM EST ----- LVEF ~ 50%. Add succinate 25 mg PO Daily.

## 2022-09-21 DIAGNOSIS — E7800 Pure hypercholesterolemia, unspecified: Secondary | ICD-10-CM | POA: Diagnosis not present

## 2022-09-21 DIAGNOSIS — E039 Hypothyroidism, unspecified: Secondary | ICD-10-CM | POA: Diagnosis not present

## 2022-09-21 DIAGNOSIS — Z0000 Encounter for general adult medical examination without abnormal findings: Secondary | ICD-10-CM | POA: Diagnosis not present

## 2022-09-21 DIAGNOSIS — E559 Vitamin D deficiency, unspecified: Secondary | ICD-10-CM | POA: Diagnosis not present

## 2022-09-30 DIAGNOSIS — I10 Essential (primary) hypertension: Secondary | ICD-10-CM | POA: Diagnosis not present

## 2022-09-30 DIAGNOSIS — Z01419 Encounter for gynecological examination (general) (routine) without abnormal findings: Secondary | ICD-10-CM | POA: Diagnosis not present

## 2022-09-30 DIAGNOSIS — Z0000 Encounter for general adult medical examination without abnormal findings: Secondary | ICD-10-CM | POA: Diagnosis not present

## 2022-09-30 DIAGNOSIS — Z23 Encounter for immunization: Secondary | ICD-10-CM | POA: Diagnosis not present

## 2022-09-30 DIAGNOSIS — E7800 Pure hypercholesterolemia, unspecified: Secondary | ICD-10-CM | POA: Diagnosis not present

## 2022-09-30 DIAGNOSIS — I480 Paroxysmal atrial fibrillation: Secondary | ICD-10-CM | POA: Diagnosis not present

## 2022-09-30 DIAGNOSIS — E559 Vitamin D deficiency, unspecified: Secondary | ICD-10-CM | POA: Diagnosis not present

## 2022-09-30 DIAGNOSIS — E039 Hypothyroidism, unspecified: Secondary | ICD-10-CM | POA: Diagnosis not present

## 2022-10-19 ENCOUNTER — Telehealth: Payer: Self-pay | Admitting: *Deleted

## 2022-10-19 ENCOUNTER — Telehealth: Payer: Self-pay | Admitting: Internal Medicine

## 2022-10-19 NOTE — Telephone Encounter (Signed)
Left a message to call back.

## 2022-10-19 NOTE — Telephone Encounter (Signed)
Pt called in about stopping her eliquis for dental work next week. Informed to have Dentist call for clearance to stop eliquis or fax over a clearance request. She states she will give them a call and let them know.    She also wants to ask if Dr. Gasper Sells can decrease her eliquis to half a tablet (2.5mg ) because she has brusing/blood clots on her hands. Please advise.

## 2022-10-19 NOTE — Telephone Encounter (Signed)
   Pre-operative Risk Assessment    Patient Name: [name]  DOB: 1946/04/22 MRN: [id number]     Precious Gilding, RN     10/19/22  4:03 PM Incomplete Note Called pt reviewed MD recommendation:  She can stop her DOAC as needed for surgery; no recent DCCV and no planned ablations. Presently given her age, weight and kidney function the lower dose of this medication is not recommended for her as it could increase her stroke risk.  After her dental work, we have f/u this Spring.  WE could talk about options that would let us potentially come off AC (Watchman FLX)  Thanks, MAC    Pt reports has had a bleed to top of left hand X2 unprovoked.  Reports excessive bleeding.   Pt did not apply pressure.  Advised pt to apply pressure for 5-10 min to help control bleeding. Pt would like to stop Eliquis and start ASA 81 mg daily.  Advised will speak with MD regarding matter.   Spoke with MD does not recommends pt continue on Eliquis.  Apply pressure when bleeding.  Referral to EP for potential Watchman procedure.        Request for Surgical Clearance    Procedure:   OSSEOUS SURGERY (PERIODONTAL SURGERY)  Date of Surgery:  Clearance 10/26/22                                 Surgeon:  DR. Mikey Bussing, DDS Surgeon's Group or Practice Name:  DR. Mikey Bussing, DDS [facility]OD[facility]  Phone number:  [phone] Fax number:  [phone]   Type of Clearance Requested:   - Medical  - Pharmacy:  Hold Apixaban (Eliquis)     Type of Anesthesia:  Local ; LIDOCAINE 2%   Additional requests/questions:    Jiles Prows   10/19/2022, 5:01 PM

## 2022-10-19 NOTE — Telephone Encounter (Signed)
Called pt reviewed MD recommendation:  She can stop her DOAC as needed for surgery; no recent DCCV and no planned ablations. Presently given her age, weight and kidney function the lower dose of this medication is not recommended for her as it could increase her stroke risk.  After her dental work, we have f/u this Spring.  WE could talk about options that would let us potentially come off AC (Watchman FLX)  Thanks, MAC   Pt reports has had a bleed to top of left hand X2 unprovoked.  Reports excessive bleeding.   Pt did not apply pressure.  Advised pt to apply pressure for 5-10 min to help control bleeding. Pt would like to stop Eliquis and start ASA 81 mg daily.  Advised will speak with MD regarding matter.  Spoke with MD does not recommends pt continue on Eliquis.  Apply pressure when bleeding.  Referral to EP for potential Watchman procedure.    Called pt advised of MD recommendation.  Pt will do some research on Watchman and contact our office if would like to proceed.

## 2022-10-19 NOTE — Telephone Encounter (Signed)
Pt is returning call.  

## 2022-10-20 NOTE — Telephone Encounter (Signed)
   Primary Cardiologist: Werner Lean, MD  Chart reviewed as part of pre-operative protocol coverage. Given past medical history and time since last visit, based on ACC/AHA guidelines, [name] would be at acceptable risk for the planned procedure without further cardiovascular testing.   Patient has stopped her Eliquis due to concern for excessive bleeding. Per Dr. Gasper Sells, she may proceed with periodontal surgery and should follow-up with him soon after.   I will route this recommendation to the requesting party via Epic fax function and remove from pre-op pool.  Please call with questions.  Emmaline Life, NP-C  10/20/2022, 8:10 AM [address], [address] Office [phone] Fax [phone]

## 2022-11-03 DIAGNOSIS — D225 Melanocytic nevi of trunk: Secondary | ICD-10-CM | POA: Diagnosis not present

## 2022-11-03 DIAGNOSIS — L720 Epidermal cyst: Secondary | ICD-10-CM | POA: Diagnosis not present

## 2022-11-03 DIAGNOSIS — D2261 Melanocytic nevi of right upper limb, including shoulder: Secondary | ICD-10-CM | POA: Diagnosis not present

## 2022-11-03 DIAGNOSIS — D692 Other nonthrombocytopenic purpura: Secondary | ICD-10-CM | POA: Diagnosis not present

## 2022-11-03 DIAGNOSIS — Z85828 Personal history of other malignant neoplasm of skin: Secondary | ICD-10-CM | POA: Diagnosis not present

## 2022-11-03 DIAGNOSIS — L570 Actinic keratosis: Secondary | ICD-10-CM | POA: Diagnosis not present

## 2022-11-03 DIAGNOSIS — Z85820 Personal history of malignant melanoma of skin: Secondary | ICD-10-CM | POA: Diagnosis not present

## 2022-11-03 DIAGNOSIS — L821 Other seborrheic keratosis: Secondary | ICD-10-CM | POA: Diagnosis not present

## 2022-11-03 DIAGNOSIS — L814 Other melanin hyperpigmentation: Secondary | ICD-10-CM | POA: Diagnosis not present

## 2022-11-22 DIAGNOSIS — M1711 Unilateral primary osteoarthritis, right knee: Secondary | ICD-10-CM | POA: Diagnosis not present

## 2022-11-27 ENCOUNTER — Telehealth: Payer: Self-pay | Admitting: Internal Medicine

## 2022-11-27 NOTE — Telephone Encounter (Signed)
Patient called the after hours cardiology line to discuss a subcutaneous bruise that start today on her left hand.  She describes swellng and streaking red on her left hand and finger.  She denies any recent trauma to her hand.  She held her apixaban.  Patient informed if it continues to spread, she should be seen by her local emergency department and to hold the apixaban for at least 24 hours.

## 2022-11-29 ENCOUNTER — Telehealth: Payer: Self-pay | Admitting: Internal Medicine

## 2022-11-29 NOTE — Telephone Encounter (Signed)
Pt c/o medication issue:  1. Name of Medication: Eliquis 5 mg  2. How are you currently taking this medication (dosage and times per day)? 2 times a day  3. Are you having a reaction (difficulty breathing--STAT)?   4. What is your medication issue? Been bleeding a lot under the skin- went to the ER on Saturday (11-27-22)- her Eliquis was stopped- still off of it- can she stay off of it until her appointment on 12-06-22 or does he wants to take a lower dose?

## 2022-12-03 NOTE — Telephone Encounter (Signed)
Left a message to call back.

## 2022-12-03 NOTE — Telephone Encounter (Signed)
Pt called in to report stopped Eliquis for a couple of days then deceased to 2.5 mg PO BID.  SQ bleeding has improved some.  Can now wear wedding band could not wear it previously. Denies falling, hitting hand, anything that would cause symptoms. Pt aware of OV on 12/06/22.  No further questions or concerns.

## 2022-12-03 NOTE — Telephone Encounter (Signed)
Patient is returning call. Transferred to Shamea, RN.  

## 2022-12-06 ENCOUNTER — Ambulatory Visit: Payer: Medicare HMO | Attending: Internal Medicine | Admitting: Internal Medicine

## 2022-12-06 ENCOUNTER — Encounter: Payer: Self-pay | Admitting: Internal Medicine

## 2022-12-06 VITALS — BP 110/62 | HR 72 | Ht 66.0 in | Wt 193.8 lb

## 2022-12-06 DIAGNOSIS — R58 Hemorrhage, not elsewhere classified: Secondary | ICD-10-CM | POA: Diagnosis not present

## 2022-12-06 DIAGNOSIS — I480 Paroxysmal atrial fibrillation: Secondary | ICD-10-CM | POA: Diagnosis not present

## 2022-12-06 DIAGNOSIS — I10 Essential (primary) hypertension: Secondary | ICD-10-CM

## 2022-12-06 DIAGNOSIS — E782 Mixed hyperlipidemia: Secondary | ICD-10-CM | POA: Diagnosis not present

## 2022-12-06 LAB — CBC
Hematocrit: 42.2 % (ref 34.0–46.6)
Hemoglobin: 14.2 g/dL (ref 11.1–15.9)
MCH: 31.7 pg (ref 26.6–33.0)
MCHC: 33.6 g/dL (ref 31.5–35.7)
MCV: 94 fL (ref 79–97)
Platelets: 244 10*3/uL (ref 150–450)
RBC: 4.48 x10E6/uL (ref 3.77–5.28)
RDW: 13.5 % (ref 11.7–15.4)
WBC: 7 10*3/uL (ref 3.4–10.8)

## 2022-12-06 NOTE — Progress Notes (Signed)
Cardiology Office Note:    Date:  12/06/2022   ID:  [name], DOB 10-Apr-1946, MRN [id number]  PCP:  Fatima Sanger, FNP   [facility] Providers Cardiologist:  Christell Constant, MD     Referring MD: No ref. provider found   CC:F/u bruising on DOAC  History of Present Illness:    [name] is a 77 y.o. female with a hx of PAF, HTN, and HLD. 2023: Second episode of PAF (self-converted)  Start DOAC and was doing fine. Had PRN diltiazem and did not need it.  Low normal LVEF, 50% by my estimate.  Started on BB.  Patient notes that she is doing poorly.   Since last visit notes she has had bruising in her hands and arms . There are no interval hospital/ED visit.   She had not been able to wear her rings because of the bleeding.  She has cut her eliquis in half despite her GFR 56 and Wt 87.9 kg.  No chest pain or pressure .  No SOB/DOE and no PND/Orthopnea.  No weight gain or leg swelling.  No palpitations or syncope .   Past Medical History:  Diagnosis Date   Cataract    bilateral -removed   History of atrial fibrillation    "15-20 years ago"   Hyperlipidemia    Hypertension    Skin cancer    melanoma and basal cell and squamous cell   Thyroid disease    doesnt have thyroid    Past Surgical History:  Procedure Laterality Date   ABDOMINAL HYSTERECTOMY     has ovaries   CATARACT EXTRACTION, BILATERAL     COLONOSCOPY     DILATION AND CURETTAGE OF UTERUS     FOOT MASS EXCISION     MELANOMA EXCISION     POLYPECTOMY      Current Medications: Current Meds  Medication Sig   amoxicillin (AMOXIL) 500 MG tablet Take 2,000 mg by mouth as directed. For dental visits only   apixaban (ELIQUIS) 5 MG TABS tablet Take 2.5 mg by mouth 2 (two) times daily.   atorvastatin (LIPITOR) 40 MG tablet Take 20 mg by mouth daily.   calcium carbonate (TUMS EX) 750 MG chewable tablet Chew 1 tablet by mouth daily. Ultra Chewable 400 mg.  Takes 2 daily.    cholecalciferol (VITAMIN D) 1000 units tablet Take 1,000 Units by mouth daily.   co-enzyme Q-10 30 MG capsule Take 200 mg by mouth daily.   diltiazem (CARDIZEM) 30 MG tablet Take 1 tablet (30 mg total) by mouth every 6 (six) hours as needed (palpitations).   hydrochlorothiazide (HYDRODIURIL) 12.5 MG tablet Take 12.5 mg by mouth daily.   levothyroxine (SYNTHROID, LEVOTHROID) 100 MCG tablet Take 88 mcg by mouth daily before breakfast.   losartan (COZAAR) 50 MG tablet Take 25 mg by mouth daily.   metoprolol succinate (TOPROL XL) 25 MG 24 hr tablet Take 1 tablet (25 mg total) by mouth daily.   Multiple Vitamin (MULTIVITAMIN) tablet Take 1 tablet by mouth daily. Nature Adult     Allergies:   Chloramphenicol, Lincomycin hcl, and Prednisone   Social History   Socioeconomic History   Marital status: Married    Spouse name: Not on file   Number of children: Not on file   Years of education: Not on file   Highest education level: Not on file  Occupational History   Not on file  Tobacco Use   Smoking status: Former  Smokeless tobacco: Never   Tobacco comments:    quit 20 yrs ago  Vaping Use   Vaping Use: Never used  Substance and Sexual Activity   Alcohol use: Yes    Alcohol/week: 0.0 standard drinks of alcohol    Comment: once monthly MAYBE   Drug use: No   Sexual activity: Not on file  Other Topics Concern   Not on file  Social History Narrative   Not on file   Social Determinants of Health   Financial Resource Strain: Not on file  Food Insecurity: Not on file  Transportation Needs: Not on file  Physical Activity: Not on file  Stress: Not on file  Social Connections: Not on file   Social: former NICU nurse, married; husband sees Dr. Teressa Lower and Ladona Ridgel  Family History: The patient's family history includes Colon cancer in her paternal grandfather. There is no history of Esophageal cancer, Rectal cancer, or Stomach cancer. Family history of AAA.  ROS:   Please see the  history of present illness.     All other systems reviewed and are negative.  EKGs/Labs/Other Studies Reviewed:    The following studies were reviewed today:  EKG:    06/01/22: sinus rhythm 05/31/22: A fib RVR anterior infarct pattern Rate 158  Cardiac Studies & Procedures       ECHOCARDIOGRAM  ECHOCARDIOGRAM COMPLETE 06/15/2022  Narrative ECHOCARDIOGRAM REPORT    Patient Name:   [name] Date of Exam: 06/15/2022 Medical Rec #:  [id number]     Height:       66.0 in Accession #:    [id number]    Weight:       188.0 lb Date of Birth:  05-29-1946     BSA:          1.948 m Patient Age:    77 years      BP:           134/66 mmHg Patient Gender: F             HR:           79 bpm. Exam Location:  [address]  Procedure: 2D Echo, 3D Echo, Cardiac Doppler, Color Doppler and Strain Analysis  Indications:    I48.91 Atrial Fibrillation  History:        Patient has no prior history of Echocardiogram examinations. Arrythmias:Atrial Fibrillation; Risk Factors:Hypertension, Dyslipidemia and Former Smoker.  Sonographer:    Farrel Conners RDCS Referring Phys: [facility] A Qasim Diveley  IMPRESSIONS   1. Left ventricular ejection fraction, by estimation, is 45 to 50%. The left ventricle has mildly decreased function. The left ventricle demonstrates global hypokinesis. Left ventricular diastolic parameters are consistent with Grade I diastolic dysfunction (impaired relaxation). The average left ventricular global longitudinal strain is -18.3 %. The global longitudinal strain is abnormal. 2. Right ventricular systolic function is normal. The right ventricular size is normal. 3. The mitral valve is normal in structure. Mild mitral valve regurgitation. No evidence of mitral stenosis. 4. The aortic valve is tricuspid. Aortic valve regurgitation is not visualized. Aortic valve sclerosis is present, with no evidence of aortic valve stenosis. 5. The inferior vena cava is normal in size  with greater than 50% respiratory variability, suggesting right atrial pressure of 3 mmHg.  FINDINGS Left Ventricle: Left ventricular ejection fraction, by estimation, is 45 to 50%. The left ventricle has mildly decreased function. The left ventricle demonstrates global hypokinesis. The average left ventricular global longitudinal strain is -18.3 %. The global longitudinal  strain is abnormal. The left ventricular internal cavity size was normal in size. There is no left ventricular hypertrophy. Left ventricular diastolic parameters are consistent with Grade I diastolic dysfunction (impaired relaxation).  Right Ventricle: The right ventricular size is normal. Right ventricular systolic function is normal.  Left Atrium: Left atrial size was normal in size.  Right Atrium: Right atrial size was normal in size.  Pericardium: There is no evidence of pericardial effusion.  Mitral Valve: The mitral valve is normal in structure. Mild mitral annular calcification. Mild mitral valve regurgitation. No evidence of mitral valve stenosis.  Tricuspid Valve: The tricuspid valve is normal in structure. Tricuspid valve regurgitation is trivial. No evidence of tricuspid stenosis.  Aortic Valve: The aortic valve is tricuspid. Aortic valve regurgitation is not visualized. Aortic valve sclerosis is present, with no evidence of aortic valve stenosis.  Pulmonic Valve: The pulmonic valve was normal in structure. Pulmonic valve regurgitation is trivial. No evidence of pulmonic stenosis.  Aorta: The aortic root is normal in size and structure.  Venous: The inferior vena cava is normal in size with greater than 50% respiratory variability, suggesting right atrial pressure of 3 mmHg.  IAS/Shunts: No atrial level shunt detected by color flow Doppler.   LEFT VENTRICLE PLAX 2D LVIDd:         3.70 cm   Diastology LVIDs:         2.70 cm   LV e' medial:    8.05 cm/s LV PW:         0.90 cm   LV E/e' medial:  7.8 LV  IVS:        1.10 cm   LV e' lateral:   10.60 cm/s LVOT diam:     2.10 cm   LV E/e' lateral: 5.9 LV SV:         56 LV SV Index:   29        2D Longitudinal Strain LVOT Area:     3.46 cm  2D Strain GLS (A2C):   -16.4 % 2D Strain GLS (A3C):   -17.7 % 2D Strain GLS (A4C):   -20.7 % 2D Strain GLS Avg:     -18.3 %  3D Volume EF: 3D EF:        52 % LV EDV:       148 ml LV ESV:       71 ml LV SV:        77 ml  RIGHT VENTRICLE RV Basal diam:  3.70 cm RV S prime:     11.60 cm/s TAPSE (M-mode): 1.8 cm  LEFT ATRIUM             Index        RIGHT ATRIUM           Index LA diam:        3.30 cm 1.69 cm/m   RA Area:     17.70 cm LA Vol (A2C):   55.6 ml 28.54 ml/m  RA Volume:   49.50 ml  25.41 ml/m LA Vol (A4C):   70.2 ml 36.04 ml/m LA Biplane Vol: 63.5 ml 32.60 ml/m AORTIC VALVE LVOT Vmax:   82.00 cm/s LVOT Vmean:  54.750 cm/s LVOT VTI:    0.163 m  AORTA Ao Root diam: 2.90 cm Ao Asc diam:  3.00 cm  MITRAL VALVE MV Area (PHT)  cm         SHUNTS MV Decel Time: 213 msec    Systemic VTI:  0.16  m MR Peak grad: 126.3 mmHg   Systemic Diam: 2.10 cm MR Mean grad: 84.0 mmHg MR Vmax:      562.00 cm/s MR Vmean:     434.0 cm/s MV E velocity: 62.60 cm/s MV A velocity: 97.75 cm/s MV E/A ratio:  0.64  Olga Millers MD Electronically signed by Olga Millers MD Signature Date/Time: 06/15/2022/4:04:48 PM    Final            Recent Labs: 05/30/2022: BUN 9; Creatinine, Ser 1.02; Magnesium 2.1; Potassium 3.1; Sodium 138; TSH 1.129 06/15/2022: Hemoglobin 14.2; Platelets 265  Recent Lipid Panel No results found for: "CHOL", "TRIG", "HDL", "CHOLHDL", "VLDL", "LDLCALC", "LDLDIRECT"  Risk Assessment/Calculations:    CHA2DS2-VASc Score = 4   This indicates a 4.8% annual risk of stroke. The patient's score is based upon: CHF History: 0 HTN History: 1 Diabetes History: 0 Stroke History: 0 Vascular Disease History: 0 Age Score: 2 Gender Score: 1             Physical Exam:     VS:  BP 110/62   Pulse 72   Ht 5\' 6"  (1.676 m)   Wt 193 lb 12.8 oz (87.9 kg)   SpO2 97%   BMI 31.28 kg/m     Wt Readings from Last 3 Encounters:  12/06/22 193 lb 12.8 oz (87.9 kg)  06/01/22 188 lb (85.3 kg)  08/26/21 190 lb (86.2 kg)    GEN:  Well nourished, well developed in no acute distress HEENT: Normal NECK: No JVD; No carotid bruits LYMPHATICS: No lymphadenopathy CARDIAC: RRR, no murmurs, rubs, gallops RESPIRATORY:  Clear to auscultation without rales, wheezing or rhonchi  ABDOMEN: Soft, non-tender, non-distended MUSCULOSKELETAL:  No edema; No deformity  SKIN: Warm and dry NEUROLOGIC:  Alert and oriented x 3 PSYCHIATRIC:  Normal affect   ASSESSMENT:    1. Bleeding   2. Paroxysmal atrial fibrillation (HCC)     PLAN:    Paroxsymal Atrial Fibrillation Hypothyroidism CHA2DS2-VASc=4, HASBLED=3 - we have discussed risks and alternatives (WATCHMAN FLX, ASA only) - Continue anticoagulation with eliquis (we have discussed guideline recommendations for DOAC); Acquired Thrombophilia - checking CBC - will get diltiazem 30 mg PO PRN q6 hr  Borderline systolic dysfunction - LVEF ~ 04% with normal strain - continue BB and ARB  HLD - at LDL goal< 70 - continue on current statin  [facility] Referral for Left Atrial Appendage Closure with Non-Valvular Atrial Fibrillation   MADYN FULLEN is a 77 y.o. female is being referred to the [facility] Team for evaluation for Left Atrial Appendage Closure with Watchman device for the management of stroke risk resulting form non-valvular atrial fibrillation.    Base upon Ms. Alvelo's history, she is felt to be a poor candidate for long-term anticoagulation because of a history of bleeding (e.g. intracerebral, subdural, GI, retro-peritoneal). Her CHADS2-VASc Score is 4 with an unadjusted Ischemic Stroke Rate (% per year) of 4.8%.    Her stroke risk necessitates a strategy of stroke prevention with either  long-term oral anticoagulation or left atrial appendage occlusion therapy. We have discussed their bleeding risk in the context of their comorbid medical problems, as well as the rationale for referral for evaluation of Watchman left atrial appendage occlusion therapy. While the patient is at high long-term bleeding risk, they may be appropriate for short-term anticoagulation. Based on this individual patient's stroke and bleeding risk, a shared decision has been made to refer the patient for consideration of Watchman left atrial  appendage closure utilizing the [employer] [facility] shared decision tool.   Fall f/u with me or APP           Medication Adjustments/Labs and Tests Ordered: Current medicines are reviewed at length with the patient today.  Concerns regarding medicines are outlined above.  Orders Placed This Encounter  Procedures   CBC   Ambulatory referral to Cardiac Electrophysiology   No orders of the defined types were placed in this encounter.   Patient Instructions  Medication Instructions:  Your physician recommends that you continue on your current medications as directed. Please refer to the Current Medication list given to you today.  *If you need a refill on your cardiac medications before your next appointment, please call your pharmacy*   Lab Work: CBC  If you have labs (blood work) drawn today and your tests are completely normal, you will receive your results only by: MyChart Message (if you have MyChart) OR A paper copy in the mail If you have any lab test that is abnormal or we need to change your treatment, we will call you to review the results.   Testing/Procedures: Your physician has referred you to Dr. Lalla Brothers EP for [facility] consideration.    Follow-Up: At [facility], you and your health needs are our priority.  As part of our continuing mission to provide you with exceptional heart care, we have created  designated Provider Care Teams.  These Care Teams include your primary Cardiologist (physician) and Advanced Practice Providers (APPs -  Physician Assistants and Nurse Practitioners) who all work together to provide you with the care you need, when you need it.     Your next appointment:   6 months  Provider:   Riley Lam, MD or Herma Carson, PA or Tereso Newcomer, [place]       Signed, Christell Constant, MD  12/06/2022 12:27 PM    [facility]

## 2022-12-06 NOTE — Patient Instructions (Addendum)
Medication Instructions:  Your physician recommends that you continue on your current medications as directed. Please refer to the Current Medication list given to you today.  *If you need a refill on your cardiac medications before your next appointment, please call your pharmacy*   Lab Work: CBC  If you have labs (blood work) drawn today and your tests are completely normal, you will receive your results only by: MyChart Message (if you have MyChart) OR A paper copy in the mail If you have any lab test that is abnormal or we need to change your treatment, we will call you to review the results.   Testing/Procedures: Your physician has referred you to Dr. Lalla Brothers EP for [facility] consideration.    Follow-Up: At [facility], you and your health needs are our priority.  As part of our continuing mission to provide you with exceptional heart care, we have created designated Provider Care Teams.  These Care Teams include your primary Cardiologist (physician) and Advanced Practice Providers (APPs -  Physician Assistants and Nurse Practitioners) who all work together to provide you with the care you need, when you need it.     Your next appointment:   6 months  Provider:   Riley Lam, MD or Herma Carson, PA or Tereso Newcomer, [place]

## 2022-12-07 ENCOUNTER — Telehealth: Payer: Self-pay

## 2022-12-07 NOTE — Telephone Encounter (Signed)
Per Dr. Izora Ribas, called to arrange Watchman consult.  Left message to call back.

## 2022-12-07 NOTE — Telephone Encounter (Signed)
Scheduled the patient for Watchman consult with Dr. Lalla Brothers 01/12/2023. The patient was grateful for call and agreed with plan.

## 2023-01-12 ENCOUNTER — Other Ambulatory Visit
Admission: RE | Admit: 2023-01-12 | Discharge: 2023-01-12 | Disposition: A | Discharge status: Home / Self Care | Payer: Medicare HMO | Attending: Cardiology | Admitting: Cardiology

## 2023-01-12 ENCOUNTER — Encounter: Payer: Self-pay | Admitting: Cardiology

## 2023-01-12 ENCOUNTER — Ambulatory Visit: Payer: Medicare HMO | Attending: Cardiology | Admitting: Cardiology

## 2023-01-12 VITALS — BP 108/60 | HR 69 | Ht 66.5 in | Wt 191.2 lb

## 2023-01-12 DIAGNOSIS — R58 Hemorrhage, not elsewhere classified: Secondary | ICD-10-CM

## 2023-01-12 DIAGNOSIS — I480 Paroxysmal atrial fibrillation: Secondary | ICD-10-CM | POA: Insufficient documentation

## 2023-01-12 DIAGNOSIS — I10 Essential (primary) hypertension: Secondary | ICD-10-CM | POA: Diagnosis not present

## 2023-01-12 LAB — BASIC METABOLIC PANEL
Anion gap: 11 (ref 5–15)
BUN: 14 mg/dL (ref 8–23)
CO2: 25 mmol/L (ref 22–32)
Calcium: 8.8 mg/dL — ABNORMAL LOW (ref 8.9–10.3)
Chloride: 102 mmol/L (ref 98–111)
Creatinine, Ser: 0.81 mg/dL (ref 0.44–1.00)
GFR, Estimated: >60 mL/min (ref >60)
Glucose, Bld: 94 mg/dL (ref 70–99)
Potassium: 3.8 mmol/L (ref 3.5–5.1)
Sodium: 138 mmol/L (ref 135–145)

## 2023-01-12 NOTE — Patient Instructions (Signed)
Medication Instructions:  Your physician recommends that you continue on your current medications as directed. Please refer to the Current Medication list given to you today.  *If you need a refill on your cardiac medications before your next appointment, please call your pharmacy*  Lab Work: TODAY: BMET You will get your lab work at [facility]) hospital.  Your lab work will be done at [facility] next to [employer].  These are walk in labs- you will not need an appointment and you do not need to be fasting.    Testing/Procedures:  Your physician has requested that you have cardiac CT. Cardiac computed tomography (CT) is a painless test that uses an x-ray machine to take clear, detailed pictures of your heart. For further information please visit [email or web address] Please follow instruction sheet as given.   Follow-Up: At [facility], you and your health needs are our priority.  As part of our continuing mission to provide you with exceptional heart care, we have created designated Provider Care Teams.  These Care Teams include your primary Cardiologist (physician) and Advanced Practice Providers (APPs -  Physician Assistants and Nurse Practitioners) who all work together to provide you with the care you need, when you need it.  You will be contacted by Nurse Navigator, Karsten Fells to schedule your pre-procedure visit and procedure date. If you have any questions she can be reached at [phone].

## 2023-01-12 NOTE — Progress Notes (Signed)
Electrophysiology Office Note:    Date:  01/12/2023   ID:  TOSHEBA KEYLON, DOB 01-03-1946, MRN [id number]  [facility] Cardiologist:  Christell Constant, MD  [facility] Electrophysiologist:  Lanier Prude, MD   Referring MD: Fatima Sanger, FNP   Chief Complaint: Atrial fibrillation  History of Present Illness:    [name] is a 77 y.o. female who I am seeing today for an evaluation of atrial fibrillation at the request of Dr. Izora Ribas.  The patient last saw Dr. Izora Ribas Dec 06, 2022.  The patient has a history of paroxysmal atrial fibrillation with the last episode occurring in November 2023.  She also has a history of hypertension and hyperlipidemia.  She was initially prescribed full-strength Eliquis but this was reduced by the patient to 2.5 mg by mouth twice daily because of bleeding in her hands causing severe swelling and pain.  She has had no further issues on half dose Eliquis.  She is in clinic today with her husband.  June 15, 2022 echo EF 45-50 RV function normal Mild MR         Their past medical, social and family history was reveiwed.   ROS:   Please see the history of present illness.    All other systems reviewed and are negative.  EKGs/Labs/Other Studies Reviewed:    The following studies were reviewed today:  May 30, 2022 EKG shows atrial fibrillation with a rapid ventricular rate    EKG:  The ekg ordered today demonstrates sinus rhythm   Physical Exam:    VS:  BP 108/60 (BP Location: Left Arm, Patient Position: Sitting, Cuff Size: Large)   Pulse 69   Ht 5' 6.5" (1.689 m)   Wt 191 lb 4 oz (86.8 kg)   SpO2 95%   BMI 30.41 kg/m     Wt Readings from Last 3 Encounters:  01/12/23 191 lb 4 oz (86.8 kg)  12/06/22 193 lb 12.8 oz (87.9 kg)  06/01/22 188 lb (85.3 kg)     GEN:  Well nourished, well developed in no acute distress CARDIAC: RRR, no murmurs, rubs, gallops RESPIRATORY:  Clear to auscultation  without rales, wheezing or rhonchi       ASSESSMENT AND PLAN:    1. Paroxysmal atrial fibrillation (HCC)   2. Bleeding   3. Primary hypertension     #Paroxysmal atrial fibrillation Low burden of arrhythmia.  Currently on subtherapeutic dose of Eliquis.  We discussed the indicated dose being 5 mg by mouth twice daily but given the patient's trouble with bleeding and swelling in her hands she has reduced it to 2.5 mg by mouth twice daily.  We discussed left atrial appendage occlusion as another mechanism to reduce her stroke risk associated with atrial fibrillation and she is interested in proceeding.  Around the time of implant, plan to use 2.5 mg by mouth twice daily of Eliquis for at least 6 months post implant.  We discussed the data supporting Eliquis 2.5 twice daily versus DAPT versus full-strength Eliquis.  She understands that this is not supported in the guidelines although she is strongly in favor of avoiding full-strength Eliquis around the time of implant.  ----------------------  I have seen [name] in the office today who is being considered for a Watchman left atrial appendage closure device. I believe they will benefit from this procedure given their history of atrial fibrillation, CHA2DS2-VASc score of 4 and unadjusted ischemic stroke rate of 4.8% per year. Unfortunately,  the patient is not felt to be a long term anticoagulation candidate secondary to bleeding and swelling involving her hands.  She is also not using therapeutic doses of anticoagulation at this time. The patient's chart has been reviewed and I feel that they would be a candidate for short term oral anticoagulation after Watchman implant.   It is my belief that after undergoing a LAA closure procedure, CUCA MOREFIELD will not need long term anticoagulation which eliminates anticoagulation side effects and major bleeding risk.   Procedural risks for the Watchman implant have been reviewed with the patient  including a 0.5% risk of stroke, <1% risk of perforation and <1% risk of device embolization. Other risks include bleeding, vascular damage, tamponade, worsening renal function, and death. The patient understands these risk and wishes to proceed.     The published clinical data on the safety and effectiveness of WATCHMAN include but are not limited to the following: - Holmes DR, Everlene Farrier, Sick P et al. for the PROTECT AF Investigators. Percutaneous closure of the left atrial appendage versus warfarin therapy for prevention of stroke in patients with atrial fibrillation: a randomised non-inferiority trial. Lancet 2009; 374: 534-42. Everlene Farrier, Doshi SK, Isa Rankin D et al. on behalf of the PROTECT AF Investigators. Percutaneous Left Atrial Appendage Closure for Stroke Prophylaxis in Patients With Atrial Fibrillation 2.3-Year Follow-up of the PROTECT AF (Watchman Left Atrial Appendage System for Embolic Protection in Patients With Atrial Fibrillation) Trial. Circulation 2013; 127:720-729. - Alli O, Doshi S,  Kar S, Reddy VY, Sievert H et al. Quality of Life Assessment in the Randomized PROTECT AF (Percutaneous Closure of the Left Atrial Appendage Versus Warfarin Therapy for Prevention of Stroke in Patients With Atrial Fibrillation) Trial of Patients at Risk for Stroke With Nonvalvular Atrial Fibrillation. J Am Coll Cardiol 2013; 61:1790-8. Aline August DR, Mia [place], Price M, Whisenant B, Sievert H, Doshi S, Huber K, Reddy V. Prospective randomized evaluation of the Watchman left atrial appendage Device in patients with atrial fibrillation versus long-term warfarin therapy; the PREVAIL trial. Journal of the [employer] of Cardiology, Vol. 4, No. 1, 2014, 1-11. - Kar S, Doshi SK, Sadhu A, Horton R, Osorio J et al. Primary outcome evaluation of a next-generation left atrial appendage closure device: results from the PINNACLE FLX trial. Circulation 2021;143(18)1754-1762.    After today's visit with the  patient which was dedicated solely for shared decision making visit regarding LAA closure device, the patient decided to proceed with the LAA appendage closure procedure scheduled to be done in the near future at [facility]. Prior to the procedure, I would like to obtain a gated CT scan of the chest with contrast timed for PV/LA visualization.    HAS-BLED score 3 Hypertension Yes  Abnormal renal and liver function (Dialysis, transplant, Cr >2.26 mg/dL /Cirrhosis or Bilirubin >2x Normal or AST/ALT/AP >3x Normal) No  Stroke No  Bleeding Yes  Labile INR (Unstable/high INR) No  Elderly (>65) Yes  Drugs or alcohol (? 8 drinks/week, anti-plt or NSAID) No   CHA2DS2-VASc Score = 4  The patient's score is based upon: CHF History: 0 HTN History: 1 Diabetes History: 0 Stroke History: 0 Vascular Disease History: 0 Age Score: 2 Gender Score: 1  #Hypertension At goal today.  Recommend checking blood pressures 1-2 times per week at home and recording the values.  Recommend bringing these recordings to the primary care physician.     Signed, Rossie Muskrat. Lalla Brothers, MD, [facility], [facility]  01/12/2023 5:10 PM    Electrophysiology [facility]

## 2023-01-24 ENCOUNTER — Telehealth (HOSPITAL_COMMUNITY): Payer: Self-pay | Admitting: Emergency Medicine

## 2023-01-24 NOTE — Telephone Encounter (Signed)
Reaching out to patient to offer assistance regarding upcoming cardiac imaging study; pt verbalizes understanding of appt date/time, parking situation and where to check in, pre-test NPO status and medications ordered, and verified current allergies; name and call back number provided for further questions should they arise Darreld Hoffer RN Navigator Cardiac Imaging [facility] [phone] office [phone] cell 

## 2023-01-25 ENCOUNTER — Ambulatory Visit (HOSPITAL_COMMUNITY)
Admission: RE | Admit: 2023-01-25 | Discharge: 2023-01-25 | Disposition: A | Discharge status: Home / Self Care | Payer: Medicare HMO | Source: Ambulatory Visit | Attending: Cardiology | Admitting: Cardiology

## 2023-01-25 DIAGNOSIS — I480 Paroxysmal atrial fibrillation: Secondary | ICD-10-CM

## 2023-01-25 MED ORDER — IOHEXOL 350 MG/ML SOLN
95.0000 mL | Freq: Once | INTRAVENOUS | Status: AC | PRN
  Administered 2023-01-25: 95 mL via INTRAVENOUS

## 2023-01-31 ENCOUNTER — Telehealth: Payer: Self-pay

## 2023-02-07 ENCOUNTER — Encounter: Payer: Self-pay | Admitting: Internal Medicine

## 2023-02-10 ENCOUNTER — Other Ambulatory Visit: Payer: Self-pay

## 2023-02-10 ENCOUNTER — Telehealth: Payer: Self-pay

## 2023-02-10 DIAGNOSIS — I480 Paroxysmal atrial fibrillation: Secondary | ICD-10-CM

## 2023-02-10 NOTE — Telephone Encounter (Signed)
Samantha Chard, NP spoke with the patient. [name] wishes to proceed with LAAO on 05/19/2023. She is scheduled for pre-procedure visit 05/02/2023.

## 2023-03-29 ENCOUNTER — Other Ambulatory Visit: Payer: Self-pay | Admitting: Registered Nurse

## 2023-03-29 DIAGNOSIS — Z1231 Encounter for screening mammogram for malignant neoplasm of breast: Secondary | ICD-10-CM

## 2023-05-02 ENCOUNTER — Ambulatory Visit: Payer: Medicare HMO

## 2023-05-11 ENCOUNTER — Ambulatory Visit: Payer: Medicare HMO

## 2023-05-11 NOTE — Progress Notes (Signed)
[facility]   MULTIDISCIPLINARY HEART VALVE CLINIC                                     Cardiology Office Note:    Date:  05/13/2023   ID:  [name], DOB 07/10/1946, MRN [id number]  PCP:  Fatima Sanger, FNP  [facility] Cardiologist:  Christell Constant, MD  [facility] Electrophysiologist:  Lanier Prude, MD   Referring MD: Fatima Sanger, FNP   Follow up - in watchman work up.   History of Present Illness:    [name] is a 77 y.o. female with a hx of HTN, HLD, mildly reduced EF (45-50%), PAF on subtherapeutic Eliquis dosing due to intolerance of full dose oral anticoagulation who presents to clinic for follow up.   The patient has a history of paroxysmal atrial fibrillation with the last episode occurring in 05/2022. She was initially prescribed full-strength Eliquis but this was reduced by the patient to 2.5 mg BID because of bleeding in her hands causing severe swelling and pain. She has had no further issues on half dose Eliquis. She was referred for LAAO with Watchman and seen by Dr. Lalla Brothers. 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  Today the patient presents to clinic for follow up. No CP or SOB. No LE edema, orthopnea or PND. No dizziness or syncope. No blood in stool or urine. No palpitations. 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 Averages 4K steps a day. She does not formally exercise.    Past Medical History:  Diagnosis Date   Cataract    bilateral -removed   History of atrial fibrillation    "15-20 years ago"   Hyperlipidemia    Hypertension    Skin cancer    melanoma and basal cell and squamous cell   Thyroid disease    doesnt have thyroid    Past Surgical History:  Procedure Laterality Date   ABDOMINAL HYSTERECTOMY     has ovaries    CATARACT EXTRACTION, BILATERAL     COLONOSCOPY     DILATION AND CURETTAGE OF UTERUS     FOOT MASS EXCISION     MELANOMA EXCISION     POLYPECTOMY      Current Medications: Current Meds  Medication Sig   amoxicillin (AMOXIL) 500 MG tablet Take 2,000 mg by mouth as directed. For dental visits only   apixaban (ELIQUIS) 5 MG TABS tablet Take 2.5 mg by mouth 2 (two) times daily.   atorvastatin (LIPITOR) 40 MG tablet Take 20 mg by mouth daily.   Calcium Carbonate Antacid 400 MG CHEW Chew 2 tablets by mouth daily. Ultra Chewable 400 mg.  Takes 2 daily.   Calcium-Phosphorus-Vitamin D (CITRACAL +D3 PO) Take 2 tablets by mouth daily.   cholecalciferol (VITAMIN D) 1000 units tablet Take 1,000 Units by mouth daily.   Coenzyme Q10 200 MG capsule Take 200 mg by mouth daily.   diltiazem (CARDIZEM) 30 MG tablet Take 1 tablet (30 mg total) by mouth every 6 (six) hours as needed (palpitations).   hydrochlorothiazide (HYDRODIURIL) 12.5 MG tablet Take 12.5 mg by mouth daily.   losartan (COZAAR) 50 MG tablet Take 25 mg by mouth daily.   metoprolol succinate (  TOPROL XL) 25 MG 24 hr tablet Take 1 tablet (25 mg total) by mouth daily.   Multiple Vitamin (MULTIVITAMIN) tablet Take 1 tablet by mouth daily. Nature Adult   SYNTHROID 88 MCG tablet Take 88 mcg by mouth every morning.      ROS:   Please see the history of present illness.    All other systems reviewed and are negative.  EKGs   EKG:  EKG is NOT ordered today.   Recent Labs: 05/30/2022: Magnesium 2.1; TSH 1.129 12/06/2022: Hemoglobin 14.2; Platelets 244 01/12/2023: BUN 14; Creatinine, Ser 0.81; Potassium 3.8; Sodium 138  Recent Lipid Panel No results found for: "CHOL", "TRIG", "HDL", "CHOLHDL", "VLDL", "LDLCALC", "LDLDIRECT"   Risk Assessment/Calculations:    HAS-BLED score 3 Hypertension Yes  Abnormal renal and liver function (Dialysis, transplant, Cr >2.26 mg/dL /Cirrhosis or Bilirubin >2x Normal or AST/ALT/AP >3x Normal) No  Stroke No   Bleeding Yes  Labile INR (Unstable/high INR) No  Elderly (>65) Yes  Drugs or alcohol (>= 8 drinks/week, anti-plt or NSAID) No    CHA2DS2-VASc Score = 4  The patient's score is based upon: CHF History: 0 HTN History: 1 Diabetes History: 0 Stroke History: 0 Vascular Disease History: 0 Age Score: 2 Gender Score: 1   Physical Exam:    VS:  BP 124/64   Pulse 80   Ht 5' 6.5" (1.689 m)   Wt 192 lb 9.6 oz (87.4 kg)   SpO2 97%   BMI 30.62 kg/m     Wt Readings from Last 3 Encounters:  05/13/23 192 lb 9.6 oz (87.4 kg)  01/12/23 191 lb 4 oz (86.8 kg)  12/06/22 193 lb 12.8 oz (87.9 kg)     GEN: Well nourished, well developed in no acute distress NECK: No JVD; No carotid bruits CARDIAC: RRR, no murmurs, rubs, gallops RESPIRATORY:  Clear to auscultation without rales, wheezing or rhonchi  ABDOMEN: Soft, non-tender, non-distended EXTREMITIES:  No edema; No deformity   ASSESSMENT:    1. Paroxysmal atrial fibrillation (HCC)   2. Bleeding   3. Essential hypertension   4. Elevated coronary artery calcium score    PLAN:    In order of problems listed above:  PAF with bleeding on full dose anticoagulation: will plan to proceed with Watchman once cleared from a CAD standpoint. Continue Toprol XL 25mg  daily and reduced dose Eliquis 2.5mg  BID for now. Has PRN diltiazem 30mg  for palpitations.   HTN: BP well controlled today. Continue Toprol XL 25mg  daily, Losartan 50mg  daily and hydrochlorothiazide 12.5mg  daily.   Elevated CAC score:  noted on pre planning coronary CT. CAC score 2774, which is 98th percentile for age and sex matched peers. Given mildy reduced EF and high CAC score, will proceed with PET stress prior to Watchman. Continue Lipitor 40mg  daily.    Informed Consent   Shared Decision Making/Informed Consent The risks [chest pain, shortness of breath, cardiac arrhythmias, dizziness, blood pressure fluctuations, myocardial infarction, stroke/transient ischemic attack,  nausea, vomiting, allergic reaction, radiation exposure, metallic taste sensation and life-threatening complications (estimated to be 1 in 10,000)], benefits (risk stratification, diagnosing coronary artery disease, treatment guidance) and alternatives of a cardiac PET stress test were discussed in detail with Ms. Lee and she agrees to proceed.       Medication Adjustments/Labs and Tests Ordered: Current medicines are reviewed at length with the patient today.  Concerns regarding medicines are outlined above.  Orders Placed This Encounter  Procedures   NM PET CT CARDIAC PERFUSION MULTI W/ABSOLUTE  BLOODFLOW   No orders of the defined types were placed in this encounter.   Patient Instructions  Medication Instructions:  Your physician recommends that you continue on your current medications as directed. Please refer to the Current Medication list given to you today.  *If you need a refill on your cardiac medications before your next appointment, please call your pharmacy*   Lab Work: None ordered   If you have labs (blood work) drawn today and your tests are completely normal, you will receive your results only by: MyChart Message (if you have MyChart) OR A paper copy in the mail If you have any lab test that is abnormal or we need to change your treatment, we will call you to review the results.   Testing/Procedures: Your physician has requested that you have a Cardiac PET scan    Follow-Up: Follow up as scheduled   Other Instructions How to Prepare for Your Cardiac PET/CT Stress Test:  1. Please do not take these medications before your test:   Metoprolol, hydrochlorothiazide, diltiazem  Your remaining medications may be taken with water.  2. Nothing to eat or drink, except water, 3 hours prior to arrival time.   NO caffeine/decaffeinated products, or chocolate 12 hours prior to arrival.  3. NO perfume, cologne or lotion on chest or abdomen area.          - FEMALES -  Please avoid wearing dresses to this appointment.  4. Total time is 1 to 2 hours; you may want to bring reading material for the waiting time.  5. Please report to Radiology at the [facility] Main Entrance 30 minutes early for your test.  [address] [place], [place] [address]   In preparation for your appointment, medication and supplies will be purchased.  Appointment availability is limited, so if you need to cancel or reschedule, please call the Radiology Department at [phone] [address]) OR [phone] [facility])  24 hours in advance to avoid a cancellation fee of $100.00  What to Expect After you Arrive:  Once you arrive and check in for your appointment, you will be taken to a preparation room within the Radiology Department.  A technologist or Nurse will obtain your medical history, verify that you are correctly prepped for the exam, and explain the procedure.  Afterwards,  an IV will be started in your arm and electrodes will be placed on your skin for EKG monitoring during the stress portion of the exam. Then you will be escorted to the PET/CT scanner.  There, staff will get you positioned on the scanner and obtain a blood pressure and EKG.  During the exam, you will continue to be connected to the EKG and blood pressure machines.  A small, safe amount of a radioactive tracer will be injected in your IV to obtain a series of pictures of your heart along with an injection of a stress agent.    After your Exam:  It is recommended that you eat a meal and drink a caffeinated beverage to counter act any effects of the stress agent.  Drink plenty of fluids for the remainder of the day and urinate frequently for the first couple of hours after the exam.  Your doctor will inform you of your test results within 7-10 business days.  For more information and frequently asked questions, please visit our website : [email or web address]  For questions about your test  or how to prepare for your test, please call: Cardiac Imaging  Nurse Navigators Office: [phone]    Signed, Cline Crock, PA-C  05/13/2023 2:09 PM    [facility]

## 2023-05-12 ENCOUNTER — Telehealth: Payer: Self-pay

## 2023-05-12 NOTE — Telephone Encounter (Signed)
Discussed pre-procedure CT with Carlean Jews, PA and Dr. Lalla Brothers.  With new finding of CAD and CAC score of >2700, LAAO will be postponed.  The patient will keep her visit with [facility] tomorrow and instead of being a pre-procedure visit for LAAO, PET scan will be discussed for CAD work up.  The patient was grateful for update and agreed with plan.

## 2023-05-12 NOTE — Telephone Encounter (Signed)
Tentatively holding the patient a spot for LAAO 06/16/2023 pending CAD evaluation.

## 2023-05-13 ENCOUNTER — Ambulatory Visit: Payer: Medicare HMO | Attending: Physician Assistant | Admitting: Physician Assistant

## 2023-05-13 ENCOUNTER — Telehealth: Payer: Self-pay | Admitting: Cardiology

## 2023-05-13 ENCOUNTER — Telehealth: Payer: Self-pay | Admitting: Internal Medicine

## 2023-05-13 VITALS — BP 124/64 | HR 80 | Ht 66.5 in | Wt 192.6 lb

## 2023-05-13 DIAGNOSIS — R931 Abnormal findings on diagnostic imaging of heart and coronary circulation: Secondary | ICD-10-CM | POA: Diagnosis not present

## 2023-05-13 DIAGNOSIS — R58 Hemorrhage, not elsewhere classified: Secondary | ICD-10-CM | POA: Diagnosis not present

## 2023-05-13 DIAGNOSIS — I480 Paroxysmal atrial fibrillation: Secondary | ICD-10-CM

## 2023-05-13 DIAGNOSIS — I10 Essential (primary) hypertension: Secondary | ICD-10-CM | POA: Diagnosis not present

## 2023-05-13 NOTE — Addendum Note (Signed)
Addended by: Georgie Chard D on: 05/13/2023 04:08 PM   Modules accepted: Orders

## 2023-05-13 NOTE — Patient Instructions (Addendum)
Medication Instructions:  Your physician recommends that you continue on your current medications as directed. Please refer to the Current Medication list given to you today.  *If you need a refill on your cardiac medications before your next appointment, please call your pharmacy*   Lab Work: None ordered   If you have labs (blood work) drawn today and your tests are completely normal, you will receive your results only by: MyChart Message (if you have MyChart) OR A paper copy in the mail If you have any lab test that is abnormal or we need to change your treatment, we will call you to review the results.   Testing/Procedures: Your physician has requested that you have a Cardiac PET scan    Follow-Up: Follow up as scheduled   Other Instructions How to Prepare for Your Cardiac PET/CT Stress Test:  1. Please do not take these medications before your test:   Metoprolol, hydrochlorothiazide, diltiazem  Your remaining medications may be taken with water.  2. Nothing to eat or drink, except water, 3 hours prior to arrival time.   NO caffeine/decaffeinated products, or chocolate 12 hours prior to arrival.  3. NO perfume, cologne or lotion on chest or abdomen area.          - FEMALES - Please avoid wearing dresses to this appointment.  4. Total time is 1 to 2 hours; you may want to bring reading material for the waiting time.  5. Please report to Radiology at the [facility] Main Entrance 30 minutes early for your test.  [address] [place], [place] [address]   In preparation for your appointment, medication and supplies will be purchased.  Appointment availability is limited, so if you need to cancel or reschedule, please call the Radiology Department at [phone] [address]) OR [phone] [facility])  24 hours in advance to avoid a cancellation fee of $100.00  What to Expect After you Arrive:  Once you arrive and check in for your appointment, you will be  taken to a preparation room within the Radiology Department.  A technologist or Nurse will obtain your medical history, verify that you are correctly prepped for the exam, and explain the procedure.  Afterwards,  an IV will be started in your arm and electrodes will be placed on your skin for EKG monitoring during the stress portion of the exam. Then you will be escorted to the PET/CT scanner.  There, staff will get you positioned on the scanner and obtain a blood pressure and EKG.  During the exam, you will continue to be connected to the EKG and blood pressure machines.  A small, safe amount of a radioactive tracer will be injected in your IV to obtain a series of pictures of your heart along with an injection of a stress agent.    After your Exam:  It is recommended that you eat a meal and drink a caffeinated beverage to counter act any effects of the stress agent.  Drink plenty of fluids for the remainder of the day and urinate frequently for the first couple of hours after the exam.  Your doctor will inform you of your test results within 7-10 business days.  For more information and frequently asked questions, please visit our website : [email or web address]  For questions about your test or how to prepare for your test, please call: Cardiac Imaging Nurse Navigators Office: [phone]

## 2023-05-13 NOTE — Telephone Encounter (Signed)
[name] called pt and answered all questions

## 2023-05-13 NOTE — Telephone Encounter (Signed)
Patient wants a call back directly from [name] regarding her upcoming schedule.

## 2023-05-13 NOTE — Telephone Encounter (Signed)
Patient called to clarify reasoning for 11/21 LAAO procedure noted on MyChart. She was seen earlier today in clinic for pre PET evaluation prior to LAAO due to elevated calcium score on CT. Initially scheduled for PET 11/20 and tentative date for LAAO of 11/21. CT navigator contacted about moving her scan earlier to allow more turn around time for PET review. If the patient needs further CAD workup plan to cancel 11/21 spot. Patient now scheduled for PET 10/29 at 12pm with an arrival time of 11:30. Reviewed with patient. Attestation added to office note.   Georgie Chard NP-C Structural Heart Team  Pager: [phone] Phone: [phone]

## 2023-05-19 ENCOUNTER — Encounter (HOSPITAL_COMMUNITY): Payer: Self-pay

## 2023-05-24 ENCOUNTER — Ambulatory Visit (HOSPITAL_COMMUNITY)
Admission: RE | Admit: 2023-05-24 | Discharge: 2023-05-24 | Disposition: A | Discharge status: Home / Self Care | Payer: Medicare HMO | Source: Ambulatory Visit | Attending: Physician Assistant | Admitting: Physician Assistant

## 2023-05-24 DIAGNOSIS — I4891 Unspecified atrial fibrillation: Secondary | ICD-10-CM | POA: Diagnosis not present

## 2023-05-24 DIAGNOSIS — I10 Essential (primary) hypertension: Secondary | ICD-10-CM | POA: Diagnosis not present

## 2023-05-24 DIAGNOSIS — R9430 Abnormal result of cardiovascular function study, unspecified: Secondary | ICD-10-CM | POA: Diagnosis not present

## 2023-05-24 DIAGNOSIS — R9439 Abnormal result of other cardiovascular function study: Secondary | ICD-10-CM | POA: Diagnosis not present

## 2023-05-24 DIAGNOSIS — R931 Abnormal findings on diagnostic imaging of heart and coronary circulation: Secondary | ICD-10-CM | POA: Insufficient documentation

## 2023-05-24 DIAGNOSIS — Z0189 Encounter for other specified special examinations: Secondary | ICD-10-CM | POA: Diagnosis not present

## 2023-05-24 DIAGNOSIS — E785 Hyperlipidemia, unspecified: Secondary | ICD-10-CM | POA: Diagnosis not present

## 2023-05-24 LAB — NM PET CT CARDIAC PERFUSION MULTI W/ABSOLUTE BLOODFLOW
LV dias vol: 90 mL (ref 46–106)
LV sys vol: 36 mL
MBFR: 2.65
Nuc Rest EF: 60 %
Nuc Stress EF: 66 %
Peak HR: 107 {beats}/min
Rest HR: 81 {beats}/min
Rest MBF: 1.33 ml/g/min
Rest Nuclear Isotope Dose: 22.5 mCi
ST Depression (mm): 0 mm
Stress MBF: 3.52 ml/g/min
Stress Nuclear Isotope Dose: 22.5 mCi

## 2023-05-24 MED ORDER — RUBIDIUM RB82 GENERATOR (RUBYFILL)
18.2400 | PACK | Freq: Once | INTRAVENOUS | Status: AC
  Administered 2023-05-24: 22.5 via INTRAVENOUS

## 2023-05-24 MED ORDER — RUBIDIUM RB82 GENERATOR (RUBYFILL)
18.2400 | PACK | Freq: Once | INTRAVENOUS | Status: AC
  Administered 2023-05-24: 22.53 via INTRAVENOUS

## 2023-05-24 MED ORDER — REGADENOSON 0.4 MG/5ML IV SOLN
INTRAVENOUS | Status: AC
  Filled 2023-05-24: qty 5

## 2023-05-24 MED ORDER — REGADENOSON 0.4 MG/5ML IV SOLN
0.4000 mg | Freq: Once | INTRAVENOUS | Status: AC
  Administered 2023-05-24: 0.4 mg via INTRAVENOUS

## 2023-06-03 ENCOUNTER — Encounter: Payer: Self-pay | Admitting: Internal Medicine

## 2023-06-03 ENCOUNTER — Ambulatory Visit: Payer: Medicare HMO | Attending: Internal Medicine | Admitting: Internal Medicine

## 2023-06-03 ENCOUNTER — Telehealth: Payer: Self-pay

## 2023-06-03 VITALS — BP 130/68 | HR 85 | Ht 66.0 in | Wt 189.8 lb

## 2023-06-03 DIAGNOSIS — R931 Abnormal findings on diagnostic imaging of heart and coronary circulation: Secondary | ICD-10-CM | POA: Diagnosis not present

## 2023-06-03 DIAGNOSIS — I480 Paroxysmal atrial fibrillation: Secondary | ICD-10-CM

## 2023-06-03 DIAGNOSIS — E782 Mixed hyperlipidemia: Secondary | ICD-10-CM | POA: Diagnosis not present

## 2023-06-03 NOTE — Telephone Encounter (Signed)
Discussed with Dr. Izora Ribas. As he saw the patient in clinic today, her pre-procedure visit for next week has been cancelled.  Dr. Reginal Lutes ordered BMET, CBC, EKG, and his nurse gave her procedure soap and instructions.  Will call the patient next week to confirm procedure instructions.

## 2023-06-03 NOTE — Patient Instructions (Signed)
Medication Instructions:  Your physician recommends that you continue on your current medications as directed. Please refer to the Current Medication list given to you today.  *If you need a refill on your cardiac medications before your next appointment, please call your pharmacy*   Lab Work: BMP, CBC  If you have labs (blood work) drawn today and your tests are completely normal, you will receive your results only by: MyChart Message (if you have MyChart) OR A paper copy in the mail If you have any lab test that is abnormal or we need to change your treatment, we will call you to review the results.   Testing/Procedures: NONE   Follow-Up: At [facility], you and your health needs are our priority.  As part of our continuing mission to provide you with exceptional heart care, we have created designated Provider Care Teams.  These Care Teams include your primary Cardiologist (physician) and Advanced Practice Providers (APPs -  Physician Assistants and Nurse Practitioners) who all work together to provide you with the care you need, when you need it.   Your next appointment:   7-8 month(s)  Provider:   Christell Constant, MD     Other Instructions We have provided you with instruction letter for Upcoming procedure scheduled on 06/16/23.

## 2023-06-03 NOTE — Progress Notes (Signed)
Cardiology Office Note:    Date:  06/03/2023   ID:  [name], DOB [birth year], MRN [id number]  PCP:  Fatima Sanger, FNP   [name] Cardiologist:  Christell Constant, MD Electrophysiologist:  Lanier Prude, MD     Referring MD: Fatima Sanger, FNP   CC: f/u incidental CAC  History of Present Illness:    [name] is a 77 y.o. female with a hx of PAF, HTN, and HLD. 2023: Second episode of PAF (self-converted)  Start DOAC and was doing fine. Had PRN diltiazem and did not need it.  Low normal LVEF, 50% by my estimate.  Started on BB. 2024: High CAC burden; delayed LAA-O.  Negative stress test.  Scheduled for 06/16/23.  Miss [name], a 77 year old with a history of atrial fibrillation and associated bleeding complications, was incorporated into my care in 2023. In 2024, the possibility of a Watchman procedure was discussed.  The patient reports feeling generally well, with no chest pain. The patient also mentions the deteriorating health of her spouse, although this is managed by a different cardiologist within my group. No SOB.  Bruising is improving.   Past Medical History:  Diagnosis Date   Cataract    bilateral -removed   History of atrial fibrillation    "15-20 years ago"   Hyperlipidemia    Hypertension    Skin cancer    melanoma and basal cell and squamous cell   Thyroid disease    doesnt have thyroid    Past Surgical History:  Procedure Laterality Date   ABDOMINAL HYSTERECTOMY     has ovaries   CATARACT EXTRACTION, BILATERAL     COLONOSCOPY     DILATION AND CURETTAGE OF UTERUS     FOOT MASS EXCISION     MELANOMA EXCISION     POLYPECTOMY      Current Medications: Current Meds  Medication Sig   amoxicillin (AMOXIL) 500 MG tablet Take 2,000 mg by mouth as directed. For dental visits only   apixaban (ELIQUIS) 5 MG TABS tablet Take 2.5 mg by mouth 2 (two) times daily.   atorvastatin (LIPITOR) 40 MG tablet Take 20 mg  by mouth daily.   Calcium Carbonate Antacid 400 MG CHEW Chew 2 tablets by mouth daily. Ultra Chewable 400 mg.  Takes 2 daily.   Calcium Citrate-Vitamin D (CITRACAL + D PO) Take 2 tablets by mouth daily.   Calcium-Phosphorus-Vitamin D (CITRACAL +D3 PO) Take 2 tablets by mouth daily.   cholecalciferol (VITAMIN D) 1000 units tablet Take 1,000 Units by mouth daily.   Coenzyme Q10 200 MG capsule Take 200 mg by mouth daily.   diltiazem (CARDIZEM) 30 MG tablet Take 1 tablet (30 mg total) by mouth every 6 (six) hours as needed (palpitations).   hydrochlorothiazide (HYDRODIURIL) 12.5 MG tablet Take 12.5 mg by mouth daily.   losartan (COZAAR) 50 MG tablet Take 25 mg by mouth daily.   metoprolol succinate (TOPROL XL) 25 MG 24 hr tablet Take 1 tablet (25 mg total) by mouth daily.   Multiple Vitamin (MULTIVITAMIN) tablet Take 1 tablet by mouth daily. Nature Adult   SYNTHROID 88 MCG tablet Take 88 mcg by mouth every morning.     Allergies:   Lincomycin hcl and Chloramphenicol   Social History   Socioeconomic History   Marital status: Married    Spouse name: Not on file   Number of children: Not on file   Years of education: Not on file  Highest education level: Not on file  Occupational History   Not on file  Tobacco Use   Smoking status: Former   Smokeless tobacco: Never   Tobacco comments:    quit 20 yrs ago  Vaping Use   Vaping status: Never Used  Substance and Sexual Activity   Alcohol use: Yes    Alcohol/week: 0.0 standard drinks of alcohol    Comment: once monthly MAYBE   Drug use: No   Sexual activity: Not on file  Other Topics Concern   Not on file  Social History Narrative   Not on file   Social Determinants of Health   Financial Resource Strain: Not on file  Food Insecurity: Not on file  Transportation Needs: Not on file  Physical Activity: Not on file  Stress: Not on file  Social Connections: Not on file   Social: former NICU nurse, married; husband sees Dr. Flora Lipps  and Ladona Ridgel  Family History: The patient's family history includes Colon cancer in her paternal grandfather. There is no history of Esophageal cancer, Rectal cancer, or Stomach cancer. Family history of AAA.  ROS:   See HPI.  EKGs/Labs/Other Studies Reviewed:    The following studies were reviewed today:  EKG:    06/01/22: sinus rhythm 05/31/22: A fib RVR anterior infarct pattern Rate 158  Cardiac Studies & Procedures     STRESS TESTS  NM PET CT CARDIAC PERFUSION MULTI W/ABSOLUTE BLOODFLOW 05/24/2023  Narrative   The study is normal. The study is low risk.   LV perfusion is normal. There is no evidence of ischemia.   Rest left ventricular function is normal. Rest EF: 60%. Stress left ventricular function is normal. Stress EF: 66%. End diastolic cavity size is normal. End systolic cavity size is normal.   Myocardial blood flow was computed to be 1.54ml/g/min at rest and 3.21ml/g/min at stress. Global myocardial blood flow reserve was 2.65 and was normal.   Coronary calcium was present on the attenuation correction CT images. Severe coronary calcifications were present. Coronary calcifications were present in the left anterior descending artery, left circumflex artery and right coronary artery distribution(s).   Electronically signed by Thurmon Fair, MD  CLINICAL DATA:  This over-read does not include interpretation of cardiac or coronary anatomy or pathology. The cardiac PET-CT interpretation by the cardiologist is attached.  COMPARISON:  None Available.  FINDINGS: No suspicious nodules, masses, or infiltrates are identified in the visualized portion of the lungs. No pleural fluid seen.  The visualized portions of the mediastinum and chest wall are unremarkable.  IMPRESSION: No significant non-cardiac abnormality identified.   Electronically Signed By: [name] M.D. On: 05/24/2023 13:27   ECHOCARDIOGRAM  ECHOCARDIOGRAM COMPLETE  06/15/2022  Narrative ECHOCARDIOGRAM REPORT    Patient Name:   [name] Date of Exam: 06/15/2022 Medical Rec #:  [id number]     Height:       66.0 in Accession #:    [id number]    Weight:       188.0 lb Date of Birth:  [birth year]     BSA:          1.948 m Patient Age:    76 years      BP:           134/66 mmHg Patient Gender: F             HR:           79 bpm. Exam Location:  [address]  Procedure: 2D  Echo, 3D Echo, Cardiac Doppler, Color Doppler and Strain Analysis  Indications:    I48.91 Atrial Fibrillation  History:        Patient has no prior history of Echocardiogram examinations. Arrythmias:Atrial Fibrillation; Risk Factors:Hypertension, Dyslipidemia and Former Smoker.  Sonographer:    Farrel Conners RDCS Referring Phys: [facility] A Dolan Xia  IMPRESSIONS   1. Left ventricular ejection fraction, by estimation, is 45 to 50%. The left ventricle has mildly decreased function. The left ventricle demonstrates global hypokinesis. Left ventricular diastolic parameters are consistent with Grade I diastolic dysfunction (impaired relaxation). The average left ventricular global longitudinal strain is -18.3 %. The global longitudinal strain is abnormal. 2. Right ventricular systolic function is normal. The right ventricular size is normal. 3. The mitral valve is normal in structure. Mild mitral valve regurgitation. No evidence of mitral stenosis. 4. The aortic valve is tricuspid. Aortic valve regurgitation is not visualized. Aortic valve sclerosis is present, with no evidence of aortic valve stenosis. 5. The inferior vena cava is normal in size with greater than 50% respiratory variability, suggesting right atrial pressure of 3 mmHg.  FINDINGS Left Ventricle: Left ventricular ejection fraction, by estimation, is 45 to 50%. The left ventricle has mildly decreased function. The left ventricle demonstrates global hypokinesis. The average left ventricular global longitudinal  strain is -18.3 %. The global longitudinal strain is abnormal. The left ventricular internal cavity size was normal in size. There is no left ventricular hypertrophy. Left ventricular diastolic parameters are consistent with Grade I diastolic dysfunction (impaired relaxation).  Right Ventricle: The right ventricular size is normal. Right ventricular systolic function is normal.  Left Atrium: Left atrial size was normal in size.  Right Atrium: Right atrial size was normal in size.  Pericardium: There is no evidence of pericardial effusion.  Mitral Valve: The mitral valve is normal in structure. Mild mitral annular calcification. Mild mitral valve regurgitation. No evidence of mitral valve stenosis.  Tricuspid Valve: The tricuspid valve is normal in structure. Tricuspid valve regurgitation is trivial. No evidence of tricuspid stenosis.  Aortic Valve: The aortic valve is tricuspid. Aortic valve regurgitation is not visualized. Aortic valve sclerosis is present, with no evidence of aortic valve stenosis.  Pulmonic Valve: The pulmonic valve was normal in structure. Pulmonic valve regurgitation is trivial. No evidence of pulmonic stenosis.  Aorta: The aortic root is normal in size and structure.  Venous: The inferior vena cava is normal in size with greater than 50% respiratory variability, suggesting right atrial pressure of 3 mmHg.  IAS/Shunts: No atrial level shunt detected by color flow Doppler.   LEFT VENTRICLE PLAX 2D LVIDd:         3.70 cm   Diastology LVIDs:         2.70 cm   LV e' medial:    8.05 cm/s LV PW:         0.90 cm   LV E/e' medial:  7.8 LV IVS:        1.10 cm   LV e' lateral:   10.60 cm/s LVOT diam:     2.10 cm   LV E/e' lateral: 5.9 LV SV:         56 LV SV Index:   29        2D Longitudinal Strain LVOT Area:     3.46 cm  2D Strain GLS (A2C):   -16.4 % 2D Strain GLS (A3C):   -17.7 % 2D Strain GLS (A4C):   -20.7 % 2D Strain GLS Avg:     -  18.3 %  3D Volume EF: 3D  EF:        52 % LV EDV:       148 ml LV ESV:       71 ml LV SV:        77 ml  RIGHT VENTRICLE RV Basal diam:  3.70 cm RV S prime:     11.60 cm/s TAPSE (M-mode): 1.8 cm  LEFT ATRIUM             Index        RIGHT ATRIUM           Index LA diam:        3.30 cm 1.69 cm/m   RA Area:     17.70 cm LA Vol (A2C):   55.6 ml 28.54 ml/m  RA Volume:   49.50 ml  25.41 ml/m LA Vol (A4C):   70.2 ml 36.04 ml/m LA Biplane Vol: 63.5 ml 32.60 ml/m AORTIC VALVE LVOT Vmax:   82.00 cm/s LVOT Vmean:  54.750 cm/s LVOT VTI:    0.163 m  AORTA Ao Root diam: 2.90 cm Ao Asc diam:  3.00 cm  MITRAL VALVE MV Area (PHT)  cm         SHUNTS MV Decel Time: 213 msec    Systemic VTI:  0.16 m MR Peak grad: 126.3 mmHg   Systemic Diam: 2.10 cm MR Mean grad: 84.0 mmHg MR Vmax:      562.00 cm/s MR Vmean:     434.0 cm/s MV E velocity: 62.60 cm/s MV A velocity: 97.75 cm/s MV E/A ratio:  0.64  Olga Millers MD Electronically signed by Olga Millers MD Signature Date/Time: 06/15/2022/4:04:48 PM    Final            Recent Labs: 12/06/2022: Hemoglobin 14.2; Platelets 244 01/12/2023: BUN 14; Creatinine, Ser 0.81; Potassium 3.8; Sodium 138  Recent Lipid Panel No results found for: "CHOL", "TRIG", "HDL", "CHOLHDL", "VLDL", "LDLCALC", "LDLDIRECT"  Risk Assessment/Calculations:    CHA2DS2-VASc Score = 4   This indicates a 4.8% annual risk of stroke. The patient's score is based upon: CHF History: 0 HTN History: 1 Diabetes History: 0 Stroke History: 0 Vascular Disease History: 0 Age Score: 2 Gender Score: 1   HAS-BLED score 3 Hypertension Yes  Abnormal renal and liver function (Dialysis, transplant, Cr >2.26 mg/dL /Cirrhosis or Bilirubin >2x Normal or AST/ALT/AP >3x Normal) No  Stroke No  Bleeding Yes  Labile INR (Unstable/high INR) No  Elderly (>65) Yes  Drugs or alcohol (>= 8 drinks/week, anti-plt or NSAID) No          Physical Exam:    VS:  BP 130/68   Pulse 85   Ht 5\' 6"  (1.676 m)    Wt 189 lb 12.8 oz (86.1 kg)   SpO2 94%   BMI 30.63 kg/m     Wt Readings from Last 3 Encounters:  06/03/23 189 lb 12.8 oz (86.1 kg)  05/13/23 192 lb 9.6 oz (87.4 kg)  01/12/23 191 lb 4 oz (86.8 kg)    GEN:  Well nourished, well developed in no acute distress HEENT: Normal NECK: No JVD; No carotid bruits LYMPHATICS: No lymphadenopathy CARDIAC: RRR, no murmurs, rubs, gallops RESPIRATORY:  Clear to auscultation without rales, wheezing or rhonchi  ABDOMEN: Soft, non-tender, non-distended MUSCULOSKELETAL:  No edema; No deformity  SKIN: Warm and dry NEUROLOGIC:  Alert and oriented x 3 PSYCHIATRIC:  Normal affect   ASSESSMENT:    1. Paroxysmal atrial fibrillation (HCC)  2. Elevated coronary artery calcium score   3. Mixed hyperlipidemia      PLAN:    Paroxsymal Atrial Fibrillation Hypothyroidism CHA2DS2-VASc=4, HASBLED=3 - Atrial fibrillation with bleeding complications. Reasonable  for Watchman procedure on 06/16/2023. Discussed risks (bleeding, stroke, device-related complications) and benefits (reduced stroke risk, elimination of long-term anticoagulation). Alternatives include continued anticoagulation therapy. - Proceed with Watchman procedure on 06/16/2023 - CBC, BMP, EKG (SR), and Watchman instructions; discussed with Karsten Fells RN  Coronary Artery Calcifications HLD Aortic atherosclerosis Incidental coronary artery calcifications found during Watchman evaluation. Normal ejection fraction and perfusion on stress test. No immediate intervention required. - negative stress test - at LDL goal< 70 - continue on current statin - continue current ARB  and BB  Summer follow up           Medication Adjustments/Labs and Tests Ordered: Current medicines are reviewed at length with the patient today.  Concerns regarding medicines are outlined above.  Orders Placed This Encounter  Procedures   Basic metabolic panel   CBC   EKG 12-Lead   No orders of the defined  types were placed in this encounter.   Patient Instructions  Medication Instructions:  Your physician recommends that you continue on your current medications as directed. Please refer to the Current Medication list given to you today.  *If you need a refill on your cardiac medications before your next appointment, please call your pharmacy*   Lab Work: BMP, CBC  If you have labs (blood work) drawn today and your tests are completely normal, you will receive your results only by: MyChart Message (if you have MyChart) OR A paper copy in the mail If you have any lab test that is abnormal or we need to change your treatment, we will call you to review the results.   Testing/Procedures: NONE   Follow-Up: At [facility], you and your health needs are our priority.  As part of our continuing mission to provide you with exceptional heart care, we have created designated Provider Care Teams.  These Care Teams include your primary Cardiologist (physician) and Advanced Practice Fuller (APPs -  Physician Assistants and Nurse Practitioners) who all work together to provide you with the care you need, when you need it.   Your next appointment:   7-8 month(s)  Provider:   Christell Constant, MD     Other Instructions We have provided you with instruction letter for Upcoming procedure scheduled on 06/16/23.   Signed, Christell Constant, MD  06/03/2023 2:54 PM    [facility]

## 2023-06-04 LAB — BASIC METABOLIC PANEL
BUN/Creatinine Ratio: 11 — ABNORMAL LOW (ref 12–28)
BUN: 12 mg/dL (ref 8–27)
CO2: 27 mmol/L (ref 20–29)
Calcium: 10 mg/dL (ref 8.7–10.3)
Chloride: 103 mmol/L (ref 96–106)
Creatinine, Ser: 1.08 mg/dL — ABNORMAL HIGH (ref 0.57–1.00)
Glucose: 87 mg/dL (ref 70–99)
Potassium: 3.8 mmol/L (ref 3.5–5.2)
Sodium: 145 mmol/L — ABNORMAL HIGH (ref 134–144)
eGFR: 52 mL/min/{1.73_m2} — ABNORMAL LOW (ref >59)

## 2023-06-04 LAB — CBC
Hematocrit: 43 % (ref 34.0–46.6)
Hemoglobin: 13.8 g/dL (ref 11.1–15.9)
MCH: 31.2 pg (ref 26.6–33.0)
MCHC: 32.1 g/dL (ref 31.5–35.7)
MCV: 97 fL (ref 79–97)
Platelets: 222 10*3/uL (ref 150–450)
RBC: 4.43 x10E6/uL (ref 3.77–5.28)
RDW: 12.2 % (ref 11.7–15.4)
WBC: 8.7 10*3/uL (ref 3.4–10.8)

## 2023-06-06 ENCOUNTER — Ambulatory Visit: Payer: Medicare HMO

## 2023-06-06 NOTE — Telephone Encounter (Signed)
Reviewed procedure instructions with patient and confirmed visit today is cancelled. She was grateful for assistance and agreed with plan.

## 2023-06-06 NOTE — Telephone Encounter (Signed)
Left message to call back  

## 2023-06-07 ENCOUNTER — Ambulatory Visit
Admission: RE | Admit: 2023-06-07 | Discharge: 2023-06-07 | Disposition: A | Discharge status: Home / Self Care | Payer: Medicare HMO | Source: Ambulatory Visit | Attending: Registered Nurse | Admitting: Registered Nurse

## 2023-06-07 DIAGNOSIS — Z1231 Encounter for screening mammogram for malignant neoplasm of breast: Secondary | ICD-10-CM | POA: Diagnosis not present

## 2023-06-08 ENCOUNTER — Other Ambulatory Visit: Payer: Self-pay | Admitting: Internal Medicine

## 2023-06-09 ENCOUNTER — Telehealth: Payer: Self-pay

## 2023-06-09 NOTE — Telephone Encounter (Signed)
The patient reports her husband is in the hospital and does not know how long he will stay.  After much discussion, she decided to postpone LAAO to next available date (09/01/2023). Scheduled her for pre-procedure visit 08/22/2023. She was grateful for call and agreed with plan.

## 2023-06-10 ENCOUNTER — Ambulatory Visit: Payer: Medicare HMO | Admitting: Internal Medicine

## 2023-06-15 ENCOUNTER — Other Ambulatory Visit (HOSPITAL_COMMUNITY): Payer: Medicare HMO

## 2023-06-16 ENCOUNTER — Ambulatory Visit: Payer: Medicare HMO | Admitting: Internal Medicine

## 2023-06-22 DIAGNOSIS — H43813 Vitreous degeneration, bilateral: Secondary | ICD-10-CM | POA: Diagnosis not present

## 2023-08-01 ENCOUNTER — Other Ambulatory Visit: Payer: Self-pay | Admitting: Internal Medicine

## 2023-08-01 DIAGNOSIS — I480 Paroxysmal atrial fibrillation: Secondary | ICD-10-CM

## 2023-08-01 NOTE — Telephone Encounter (Signed)
 Eliquis 5mg  refill request received. Patient is 78 years old, weight-86.1kg, Crea-1.08 on 06/03/23, Diagnosis-Afib, and last seen by Dr. Izora Ribas on 06/03/23. Dose is appropriate based on dosing criteria. Will send in refill to requested pharmacy.

## 2023-08-17 NOTE — Progress Notes (Signed)
[facility]                                     Cardiology Office Note:    Date:  08/22/2023   ID:  [name], DOB [birth year], MRN [id number]  PCP:  Fatima Sanger, FNP  [facility] Cardiologist:  Christell Constant, MD  [facility] Electrophysiologist:  Lanier Prude, MD   Referring MD: Fatima Sanger, FNP   Chief Complaint  Patient presents with   pre LAAO    History of Present Illness:    [name] is a 78 y.o. female with a hx of HTN, HLD, mildly reduced EF (45-50%), PAF on subtherapeutic Eliquis dosing due to intolerance of full dose oral anticoagulation who presents to clinic for follow up.    The patient has a history of paroxysmal atrial fibrillation with the last episode occurring in 05/2022. She was initially prescribed full-strength Eliquis but this was reduced by the patient to 2.5 mg BID because of bleeding in her hands causing severe swelling and pain. She has had no further issues on half dose Eliquis. She was referred for LAAO with Watchman and seen by Dr. Lalla Brothers. 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  PET CT was normal with no evidence of ischemia. Timing of Watchman was delayed due to family health issues. She presents today for evaluation prior to LAAO scheduled for 09/01/23.   Today she states she has been well and is ready for her procedure. She denies chest pain, SOB, palpitations, LE edema, orthopnea, PND, dizziness, or syncope. Denies bleeding in stool or urine.    Past Medical History:  Diagnosis Date   Cataract    bilateral -removed   History of atrial fibrillation    "15-20 years ago"   Hyperlipidemia    Hypertension    Skin cancer    melanoma and basal cell and squamous cell   Thyroid disease    doesnt have thyroid    Past Surgical History:  Procedure Laterality Date   ABDOMINAL  HYSTERECTOMY     has ovaries   CATARACT EXTRACTION, BILATERAL     COLONOSCOPY     DILATION AND CURETTAGE OF UTERUS     FOOT MASS EXCISION     MELANOMA EXCISION     POLYPECTOMY      Current Medications: Current Meds  Medication Sig   amoxicillin (AMOXIL) 500 MG tablet Take 2,000 mg by mouth as directed. For dental visits only   apixaban (ELIQUIS) 5 MG TABS tablet TAKE 1 TABLET BY MOUTH 2 TIMES DAILY.   atorvastatin (LIPITOR) 40 MG tablet Take 20 mg by mouth daily.   Calcium Carbonate Antacid 400 MG CHEW Chew 2 tablets by mouth daily. Ultra Chewable 400 mg.  Takes 2 daily.   Calcium Citrate-Vitamin D (CITRACAL + D PO) Take 2 tablets by mouth daily.   Calcium-Phosphorus-Vitamin D (CITRACAL +D3 PO) Take 2 tablets by mouth daily.   cholecalciferol (VITAMIN D) 1000 units tablet Take 1,000 Units by mouth daily.   Coenzyme Q10 200 MG capsule Take 200 mg by mouth daily.   diltiazem (CARDIZEM) 30 MG tablet Take 1 tablet (30 mg total) by mouth every 6 (six) hours as needed (palpitations).   hydrochlorothiazide (HYDRODIURIL) 12.5 MG tablet Take 12.5 mg by  mouth daily.   losartan (COZAAR) 50 MG tablet Take 25 mg by mouth daily.   metoprolol succinate (TOPROL-XL) 25 MG 24 hr tablet TAKE 1 TABLET BY MOUTH DAILY.   Multiple Vitamin (MULTIVITAMIN) tablet Take 1 tablet by mouth daily. Nature Adult   SYNTHROID 88 MCG tablet Take 88 mcg by mouth every morning.     Allergies:   Lincomycin hcl and Chloramphenicol   Social History   Socioeconomic History   Marital status: Married    Spouse name: Not on file   Number of children: Not on file   Years of education: Not on file   Highest education level: Not on file  Occupational History   Not on file  Tobacco Use   Smoking status: Former   Smokeless tobacco: Never   Tobacco comments:    quit 20 yrs ago  Vaping Use   Vaping status: Never Used  Substance and Sexual Activity   Alcohol use: Yes    Alcohol/week: 0.0 standard drinks of alcohol     Comment: once monthly MAYBE   Drug use: No   Sexual activity: Not on file  Other Topics Concern   Not on file  Social History Narrative   Not on file   Social Drivers of Health   Financial Resource Strain: Not on file  Food Insecurity: Not on file  Transportation Needs: Not on file  Physical Activity: Not on file  Stress: Not on file  Social Connections: Not on file     Family History: The patient's family history includes Colon cancer in her paternal grandfather. There is no history of Esophageal cancer, Rectal cancer, or Stomach cancer.  ROS:   Please see the history of present illness.    All other systems reviewed and are negative.  EKGs/Labs/Other Studies Reviewed:    The following studies were reviewed today:   Cardiac Studies & Procedures     STRESS TESTS  NM PET CT CARDIAC PERFUSION MULTI W/ABSOLUTE BLOODFLOW 05/24/2023  Narrative   The study is normal. The study is low risk.   LV perfusion is normal. There is no evidence of ischemia.   Rest left ventricular function is normal. Rest EF: 60%. Stress left ventricular function is normal. Stress EF: 66%. End diastolic cavity size is normal. End systolic cavity size is normal.   Myocardial blood flow was computed to be 1.16ml/g/min at rest and 3.72ml/g/min at stress. Global myocardial blood flow reserve was 2.65 and was normal.   Coronary calcium was present on the attenuation correction CT images. Severe coronary calcifications were present. Coronary calcifications were present in the left anterior descending artery, left circumflex artery and right coronary artery distribution(s).   Electronically signed by Thurmon Fair, MD  CLINICAL DATA:  This over-read does not include interpretation of cardiac or coronary anatomy or pathology. The cardiac PET-CT interpretation by the cardiologist is attached.  COMPARISON:  None Available.  FINDINGS: No suspicious nodules, masses, or infiltrates are identified in  the visualized portion of the lungs. No pleural fluid seen.  The visualized portions of the mediastinum and chest wall are unremarkable.  IMPRESSION: No significant non-cardiac abnormality identified.   Electronically Signed By: Danae Orleans M.D. On: 05/24/2023 13:27  ECHOCARDIOGRAM  ECHOCARDIOGRAM COMPLETE 06/15/2022  Narrative ECHOCARDIOGRAM REPORT    Patient Name:   NIMA BAMBURG Date of Exam: 06/15/2022 Medical Rec #:  [id number]     Height:       66.0 in Accession #:    [id number]  Weight:       188.0 lb Date of Birth:  1946-02-12     BSA:          1.948 m Patient Age:    76 years      BP:           134/66 mmHg Patient Gender: F             HR:           79 bpm. Exam Location:  [address]  Procedure: 2D Echo, 3D Echo, Cardiac Doppler, Color Doppler and Strain Analysis  Indications:    I48.91 Atrial Fibrillation  History:        Patient has no prior history of Echocardiogram examinations. Arrythmias:Atrial Fibrillation; Risk Factors:Hypertension, Dyslipidemia and Former Smoker.  Sonographer:    Farrel Conners RDCS Referring Phys: [facility] A CHANDRASEKHAR  IMPRESSIONS   1. Left ventricular ejection fraction, by estimation, is 45 to 50%. The left ventricle has mildly decreased function. The left ventricle demonstrates global hypokinesis. Left ventricular diastolic parameters are consistent with Grade I diastolic dysfunction (impaired relaxation). The average left ventricular global longitudinal strain is -18.3 %. The global longitudinal strain is abnormal. 2. Right ventricular systolic function is normal. The right ventricular size is normal. 3. The mitral valve is normal in structure. Mild mitral valve regurgitation. No evidence of mitral stenosis. 4. The aortic valve is tricuspid. Aortic valve regurgitation is not visualized. Aortic valve sclerosis is present, with no evidence of aortic valve stenosis. 5. The inferior vena cava is normal in size with greater  than 50% respiratory variability, suggesting right atrial pressure of 3 mmHg.  FINDINGS Left Ventricle: Left ventricular ejection fraction, by estimation, is 45 to 50%. The left ventricle has mildly decreased function. The left ventricle demonstrates global hypokinesis. The average left ventricular global longitudinal strain is -18.3 %. The global longitudinal strain is abnormal. The left ventricular internal cavity size was normal in size. There is no left ventricular hypertrophy. Left ventricular diastolic parameters are consistent with Grade I diastolic dysfunction (impaired relaxation).  Right Ventricle: The right ventricular size is normal. Right ventricular systolic function is normal.  Left Atrium: Left atrial size was normal in size.  Right Atrium: Right atrial size was normal in size.  Pericardium: There is no evidence of pericardial effusion.  Mitral Valve: The mitral valve is normal in structure. Mild mitral annular calcification. Mild mitral valve regurgitation. No evidence of mitral valve stenosis.  Tricuspid Valve: The tricuspid valve is normal in structure. Tricuspid valve regurgitation is trivial. No evidence of tricuspid stenosis.  Aortic Valve: The aortic valve is tricuspid. Aortic valve regurgitation is not visualized. Aortic valve sclerosis is present, with no evidence of aortic valve stenosis.  Pulmonic Valve: The pulmonic valve was normal in structure. Pulmonic valve regurgitation is trivial. No evidence of pulmonic stenosis.  Aorta: The aortic root is normal in size and structure.  Venous: The inferior vena cava is normal in size with greater than 50% respiratory variability, suggesting right atrial pressure of 3 mmHg.  IAS/Shunts: No atrial level shunt detected by color flow Doppler.   LEFT VENTRICLE PLAX 2D LVIDd:         3.70 cm   Diastology LVIDs:         2.70 cm   LV e' medial:    8.05 cm/s LV PW:         0.90 cm   LV E/e' medial:  7.8 LV IVS:  1.10 cm   LV e' lateral:   10.60 cm/s LVOT diam:     2.10 cm   LV E/e' lateral: 5.9 LV SV:         56 LV SV Index:   29        2D Longitudinal Strain LVOT Area:     3.46 cm  2D Strain GLS (A2C):   -16.4 % 2D Strain GLS (A3C):   -17.7 % 2D Strain GLS (A4C):   -20.7 % 2D Strain GLS Avg:     -18.3 %  3D Volume EF: 3D EF:        52 % LV EDV:       148 ml LV ESV:       71 ml LV SV:        77 ml  RIGHT VENTRICLE RV Basal diam:  3.70 cm RV S prime:     11.60 cm/s TAPSE (M-mode): 1.8 cm  LEFT ATRIUM             Index        RIGHT ATRIUM           Index LA diam:        3.30 cm 1.69 cm/m   RA Area:     17.70 cm LA Vol (A2C):   55.6 ml 28.54 ml/m  RA Volume:   49.50 ml  25.41 ml/m LA Vol (A4C):   70.2 ml 36.04 ml/m LA Biplane Vol: 63.5 ml 32.60 ml/m AORTIC VALVE LVOT Vmax:   82.00 cm/s LVOT Vmean:  54.750 cm/s LVOT VTI:    0.163 m  AORTA Ao Root diam: 2.90 cm Ao Asc diam:  3.00 cm  MITRAL VALVE MV Area (PHT)  cm         SHUNTS MV Decel Time: 213 msec    Systemic VTI:  0.16 m MR Peak grad: 126.3 mmHg   Systemic Diam: 2.10 cm MR Mean grad: 84.0 mmHg MR Vmax:      562.00 cm/s MR Vmean:     434.0 cm/s MV E velocity: 62.60 cm/s MV A velocity: 97.75 cm/s MV E/A ratio:  0.64  Olga Millers MD Electronically signed by Olga Millers MD Signature Date/Time: 06/15/2022/4:04:48 PM    Final              EKG:  EKG is ordered today.  The ekg ordered today demonstrates AF with HR 80bpm  Recent Labs: 06/03/2023: BUN 12; Creatinine, Ser 1.08; Hemoglobin 13.8; Platelets 222; Potassium 3.8; Sodium 145   Recent Lipid Panel No results found for: "CHOL", "TRIG", "HDL", "CHOLHDL", "VLDL", "LDLCALC", "LDLDIRECT"  Risk Assessment/Calculations:    HAS-BLED score 3 Hypertension Yes  Abnormal renal and liver function (Dialysis, transplant, Cr >2.26 mg/dL /Cirrhosis or Bilirubin >2x Normal or AST/ALT/AP >3x Normal) No  Stroke No  Bleeding Yes  Labile INR (Unstable/high INR) No   Elderly (>65) Yes  Drugs or alcohol (>= 8 drinks/week, anti-plt or NSAID) No    CHA2DS2-VASc Score = 4  The patient's score is based upon: CHF History: 0 HTN History: 1 Diabetes History: 0 Stroke History: 0 Vascular Disease History: 0 Age Score: 2 Gender Score: 1  Physical Exam:    VS:  BP 110/60   Pulse 80   Ht 5\' 6"  (1.676 m)   Wt 188 lb 12.8 oz (85.6 kg)   SpO2 91%   BMI 30.47 kg/m     Wt Readings from Last 3 Encounters:  08/22/23 188 lb 12.8 oz (85.6  kg)  06/03/23 189 lb 12.8 oz (86.1 kg)  05/13/23 192 lb 9.6 oz (87.4 kg)    General: Well developed, well nourished, NAD Lungs:Clear to ausculation bilaterally. No wheezes, rales, or rhonchi. Breathing is unlabored. Cardiovascular: Irregular. No murmurs Extremities: No edema.  Neuro: Alert and oriented. No focal deficits. No facial asymmetry. MAE spontaneously. Psych: Responds to questions appropriately with normal affect.    ASSESSMENT/PLAN:    PAF with bleeding on full dose anticoagulation: Scheduled for LAAO with Dr. Lalla Brothers. Instruction letter reviewed with understanding. CHG soap given. Obtain CBC, BMET. Plan post implant follow up with our team.    HTN: BP well controlled today. Continue Toprol XL 25mg  daily, Losartan 50mg  daily and hydrochlorothiazide 12.5mg  daily.    Elevated CAC score: CCS score 2774, which is 98th percentile for age and sex matched peers. Given mildy reduced EF and high CAC score PET stress obtained with no acute findings. Plan to proceed with LAAO. Will need long term ASA 81mg  after Watchman therapy complete.   I spent 25 minutes caring for this patient today including face-to-face discussions, ordering and reviewing labs, reviewing records from [facility] and other outside facilities, documenting in the record, and arranging for follow up.     Medication Adjustments/Labs and Tests Ordered: Current medicines are reviewed at length with the patient today.  Concerns regarding medicines are  outlined above.  Orders Placed This Encounter  Procedures   Basic Metabolic Panel (BMET)   CBC   EKG 12-Lead   No orders of the defined types were placed in this encounter.   Patient Instructions  Medication Instructions:  Your physician recommends that you continue on your current medications as directed. Please refer to the Current Medication list given to you today.   *If you need a refill on your cardiac medications before your next appointment, please call your pharmacy*   Lab Work: TODAY:  BMET & CBC  If you have labs (blood work) drawn today and your tests are completely normal, you will receive your results only by: MyChart Message (if you have MyChart) OR A paper copy in the mail If you have any lab test that is abnormal or we need to change your treatment, we will call you to review the results.   Testing/Procedures: None ordered   Follow-Up: At [facility], you and your health needs are our priority.  As part of our continuing mission to provide you with exceptional heart care, we have created designated Provider Care Teams.  These Care Teams include your primary Cardiologist (physician) and Advanced Practice Providers (APPs -  Physician Assistants and Nurse Practitioners) who all work together to provide you with the care you need, when you need it.  We recommend signing up for the patient portal called "MyChart".  Sign up information is provided on this After Visit Summary.  MyChart is used to connect with patients for Virtual Visits (Telemedicine).  Patients are able to view lab/test results, encounter notes, upcoming appointments, etc.  Non-urgent messages can be sent to your provider as well.   To learn more about what you can do with MyChart, go to [email or web address].    Your next appointment:   Will call   Provider:   Georgie Chard, NP or Carlean Jews, PA-C    Other Instructions   1st Floor: - Lobby - Registration  - Pharmacy  -  Lab - Cafe  2nd Floor: - PV Lab - Diagnostic Testing (echo, CT, nuclear med)  3rd Floor: -  Vacant  4th Floor: - TCTS (cardiothoracic surgery) - AFib Clinic - Structural Heart Clinic - Vascular Surgery  - Vascular Ultrasound  5th Floor: - HeartCare Cardiology (general and EP) - Clinical Pharmacy for coumadin, hypertension, lipid, weight-loss medications, and med management appointments    Valet parking services will be available as well.           Signed, Georgie Chard, NP  08/22/2023 11:06 AM    [facility]

## 2023-08-22 ENCOUNTER — Ambulatory Visit: Payer: Medicare HMO | Attending: Cardiology | Admitting: Cardiology

## 2023-08-22 VITALS — BP 110/60 | HR 80 | Ht 66.0 in | Wt 188.8 lb

## 2023-08-22 DIAGNOSIS — I480 Paroxysmal atrial fibrillation: Secondary | ICD-10-CM | POA: Diagnosis not present

## 2023-08-22 DIAGNOSIS — D6869 Other thrombophilia: Secondary | ICD-10-CM | POA: Diagnosis not present

## 2023-08-22 DIAGNOSIS — Z01818 Encounter for other preprocedural examination: Secondary | ICD-10-CM | POA: Diagnosis not present

## 2023-08-22 DIAGNOSIS — R931 Abnormal findings on diagnostic imaging of heart and coronary circulation: Secondary | ICD-10-CM | POA: Diagnosis not present

## 2023-08-22 NOTE — Patient Instructions (Signed)
Medication Instructions:  Your physician recommends that you continue on your current medications as directed. Please refer to the Current Medication list given to you today.  *If you need a refill on your cardiac medications before your next appointment, please call your pharmacy*   Lab Work: TODAY: BMET & CBC  If you have labs (blood work) drawn today and your tests are completely normal, you will receive your results only by: MyChart Message (if you have MyChart) OR A paper copy in the mail If you have any lab test that is abnormal or we need to change your treatment, we will call you to review the results.   Testing/Procedures: None ordered   Follow-Up: At [facility], you and your health needs are our priority.  As part of our continuing mission to provide you with exceptional heart care, we have created designated Provider Care Teams.  These Care Teams include your primary Cardiologist (physician) and Advanced Practice Providers (APPs -  Physician Assistants and Nurse Practitioners) who all work together to provide you with the care you need, when you need it.  We recommend signing up for the patient portal called "MyChart".  Sign up information is provided on this After Visit Summary.  MyChart is used to connect with patients for Virtual Visits (Telemedicine).  Patients are able to view lab/test results, encounter notes, upcoming appointments, etc.  Non-urgent messages can be sent to your provider as well.   To learn more about what you can do with MyChart, go to [email or web address].    Your next appointment:   Will call   Provider:   Georgie Chard, NP or Carlean Jews, PA-C    Other Instructions   1st Floor: - Lobby - Registration  - Pharmacy  - Lab - Cafe  2nd Floor: - PV Lab - Diagnostic Testing (echo, CT, nuclear med)  3rd Floor: - Vacant  4th Floor: - TCTS (cardiothoracic surgery) - AFib Clinic - Structural Heart Clinic - Vascular Surgery   - Vascular Ultrasound  5th Floor: - HeartCare Cardiology (general and EP) - Clinical Pharmacy for coumadin, hypertension, lipid, weight-loss medications, and med management appointments    Valet parking services will be available as well.

## 2023-08-23 LAB — CBC
Hematocrit: 43.7 % (ref 34.0–46.6)
Hemoglobin: 14.6 g/dL (ref 11.1–15.9)
MCH: 31.7 pg (ref 26.6–33.0)
MCHC: 33.4 g/dL (ref 31.5–35.7)
MCV: 95 fL (ref 79–97)
Platelets: 239 10*3/uL (ref 150–450)
RBC: 4.61 x10E6/uL (ref 3.77–5.28)
RDW: 11.8 % (ref 11.7–15.4)
WBC: 9.3 10*3/uL (ref 3.4–10.8)

## 2023-08-23 LAB — BASIC METABOLIC PANEL
BUN/Creatinine Ratio: 13 (ref 12–28)
BUN: 14 mg/dL (ref 8–27)
CO2: 25 mmol/L (ref 20–29)
Calcium: 9.9 mg/dL (ref 8.7–10.3)
Chloride: 102 mmol/L (ref 96–106)
Creatinine, Ser: 1.05 mg/dL — ABNORMAL HIGH (ref 0.57–1.00)
Glucose: 86 mg/dL (ref 70–99)
Potassium: 4.3 mmol/L (ref 3.5–5.2)
Sodium: 142 mmol/L (ref 134–144)
eGFR: 54 mL/min/{1.73_m2} — ABNORMAL LOW (ref >59)

## 2023-08-25 ENCOUNTER — Telehealth: Payer: Self-pay

## 2023-08-25 NOTE — Telephone Encounter (Signed)
Confirmed procedure date of 09/01/2023. Confirmed arrival time of 0530 for procedure time at 0730. Reviewed pre-procedure instructions with patient. She understands to call if questions/concerns arise prior to procedure.  She was grateful for call and agreed with plan.

## 2023-09-01 ENCOUNTER — Inpatient Hospital Stay (HOSPITAL_COMMUNITY): Payer: Medicare HMO

## 2023-09-01 ENCOUNTER — Encounter (HOSPITAL_COMMUNITY): Payer: Self-pay | Admitting: Cardiology

## 2023-09-01 ENCOUNTER — Encounter (HOSPITAL_COMMUNITY)
Admission: RE | Disposition: A | Discharge status: Home / Self Care | Payer: Medicare HMO | Source: Home / Self Care | Attending: Cardiology

## 2023-09-01 ENCOUNTER — Other Ambulatory Visit: Payer: Self-pay

## 2023-09-01 ENCOUNTER — Inpatient Hospital Stay (HOSPITAL_COMMUNITY)
Admission: RE | Admit: 2023-09-01 | Discharge: 2023-09-01 | DRG: 274 | Disposition: A | Discharge status: Home / Self Care | Payer: Medicare HMO | Attending: Cardiology | Admitting: Cardiology

## 2023-09-01 DIAGNOSIS — Z7901 Long term (current) use of anticoagulants: Secondary | ICD-10-CM

## 2023-09-01 DIAGNOSIS — E039 Hypothyroidism, unspecified: Secondary | ICD-10-CM | POA: Diagnosis not present

## 2023-09-01 DIAGNOSIS — E785 Hyperlipidemia, unspecified: Secondary | ICD-10-CM | POA: Diagnosis present

## 2023-09-01 DIAGNOSIS — Z01818 Encounter for other preprocedural examination: Secondary | ICD-10-CM | POA: Diagnosis not present

## 2023-09-01 DIAGNOSIS — Z006 Encounter for examination for normal comparison and control in clinical research program: Secondary | ICD-10-CM

## 2023-09-01 DIAGNOSIS — Z888 Allergy status to other drugs, medicaments and biological substances status: Secondary | ICD-10-CM

## 2023-09-01 DIAGNOSIS — Z87891 Personal history of nicotine dependence: Secondary | ICD-10-CM | POA: Diagnosis not present

## 2023-09-01 DIAGNOSIS — I10 Essential (primary) hypertension: Secondary | ICD-10-CM

## 2023-09-01 DIAGNOSIS — I480 Paroxysmal atrial fibrillation: Secondary | ICD-10-CM | POA: Diagnosis not present

## 2023-09-01 DIAGNOSIS — I4891 Unspecified atrial fibrillation: Secondary | ICD-10-CM

## 2023-09-01 DIAGNOSIS — D6869 Other thrombophilia: Secondary | ICD-10-CM | POA: Diagnosis present

## 2023-09-01 DIAGNOSIS — Z79899 Other long term (current) drug therapy: Secondary | ICD-10-CM

## 2023-09-01 DIAGNOSIS — Z881 Allergy status to other antibiotic agents status: Secondary | ICD-10-CM

## 2023-09-01 DIAGNOSIS — Z95818 Presence of other cardiac implants and grafts: Principal | ICD-10-CM

## 2023-09-01 HISTORY — PX: TRANSESOPHAGEAL ECHOCARDIOGRAM (CATH LAB): EP1270

## 2023-09-01 HISTORY — PX: LEFT ATRIAL APPENDAGE OCCLUSION: EP1229

## 2023-09-01 HISTORY — DX: Presence of other cardiac implants and grafts: Z95.818

## 2023-09-01 LAB — TYPE AND SCREEN
ABO/RH(D): O POS
Antibody Screen: NEGATIVE

## 2023-09-01 LAB — ECHO TEE

## 2023-09-01 LAB — SURGICAL PCR SCREEN
MRSA, PCR: NEGATIVE
Staphylococcus aureus: NEGATIVE

## 2023-09-01 LAB — ABO/RH: ABO/RH(D): O POS

## 2023-09-01 LAB — POCT ACTIVATED CLOTTING TIME: Activated Clotting Time: 279 s

## 2023-09-01 SURGERY — LEFT ATRIAL APPENDAGE OCCLUSION
Anesthesia: General

## 2023-09-01 MED ORDER — ACETAMINOPHEN 325 MG PO TABS
650.0000 mg | ORAL_TABLET | ORAL | Status: DC | PRN
  Administered 2023-09-01: 650 mg via ORAL
  Filled 2023-09-01: qty 2

## 2023-09-01 MED ORDER — DEXAMETHASONE SODIUM PHOSPHATE 10 MG/ML IJ SOLN
INTRAMUSCULAR | Status: DC | PRN
  Administered 2023-09-01: 10 mg via INTRAVENOUS

## 2023-09-01 MED ORDER — PROTAMINE SULFATE 10 MG/ML IV SOLN
INTRAVENOUS | Status: DC | PRN
  Administered 2023-09-01: 30 mg via INTRAVENOUS

## 2023-09-01 MED ORDER — HEPARIN SODIUM (PORCINE) 1000 UNIT/ML IJ SOLN
INTRAMUSCULAR | Status: DC | PRN
  Administered 2023-09-01: 12000 [IU] via INTRAVENOUS
  Administered 2023-09-01: 3000 [IU] via INTRAVENOUS

## 2023-09-01 MED ORDER — HEPARIN (PORCINE) IN NACL 1000-0.9 UT/500ML-% IV SOLN
INTRAVENOUS | Status: DC | PRN
  Administered 2023-09-01: 1000 mL

## 2023-09-01 MED ORDER — LACTATED RINGERS IV SOLN: INTRAVENOUS | Status: DC | PRN

## 2023-09-01 MED ORDER — CHLORHEXIDINE GLUCONATE 0.12 % MT SOLN
15.0000 mL | Freq: Once | OROMUCOSAL | Status: AC
  Administered 2023-09-01: 15 mL via OROMUCOSAL
  Filled 2023-09-01: qty 15

## 2023-09-01 MED ORDER — SODIUM CHLORIDE 0.9% FLUSH
3.0000 mL | Freq: Two times a day (BID) | INTRAVENOUS | Status: DC
  Administered 2023-09-01: 3 mL via INTRAVENOUS

## 2023-09-01 MED ORDER — ONDANSETRON HCL 4 MG/2ML IJ SOLN: 4.0000 mg | Freq: Four times a day (QID) | INTRAMUSCULAR | Status: DC | PRN

## 2023-09-01 MED ORDER — SODIUM CHLORIDE 0.9 % IV SOLN: INTRAVENOUS | Status: DC

## 2023-09-01 MED ORDER — CHLORHEXIDINE GLUCONATE 4 % EX SOLN: Freq: Once | CUTANEOUS | Status: DC

## 2023-09-01 MED ORDER — IOHEXOL 350 MG/ML SOLN
INTRAVENOUS | Status: DC | PRN
  Administered 2023-09-01: 5 mL
  Administered 2023-09-01: 10 mL

## 2023-09-01 MED ORDER — SUGAMMADEX SODIUM 200 MG/2ML IV SOLN
INTRAVENOUS | Status: DC | PRN
  Administered 2023-09-01: 170.6 mg via INTRAVENOUS

## 2023-09-01 MED ORDER — FENTANYL CITRATE (PF) 100 MCG/2ML IJ SOLN
INTRAMUSCULAR | Status: AC
  Filled 2023-09-01: qty 2

## 2023-09-01 MED ORDER — SODIUM CHLORIDE 0.9% FLUSH: 3.0000 mL | INTRAVENOUS | Status: DC | PRN

## 2023-09-01 MED ORDER — PROPOFOL 10 MG/ML IV BOLUS
INTRAVENOUS | Status: DC | PRN
  Administered 2023-09-01: 100 ug/kg/min via INTRAVENOUS
  Administered 2023-09-01: 130 mg via INTRAVENOUS

## 2023-09-01 MED ORDER — ORAL CARE MOUTH RINSE: 15.0000 mL | OROMUCOSAL | Status: DC | PRN

## 2023-09-01 MED ORDER — LIDOCAINE 2% (20 MG/ML) 5 ML SYRINGE
INTRAMUSCULAR | Status: DC | PRN
  Administered 2023-09-01: 60 mg via INTRAVENOUS

## 2023-09-01 MED ORDER — ONDANSETRON HCL 4 MG/2ML IJ SOLN
INTRAMUSCULAR | Status: DC | PRN
  Administered 2023-09-01: 4 mg via INTRAVENOUS

## 2023-09-01 MED ORDER — APIXABAN 2.5 MG PO TABS: 2.5000 mg | ORAL_TABLET | Freq: Two times a day (BID) | ORAL | 2 refills | Status: DC

## 2023-09-01 MED ORDER — ROCURONIUM BROMIDE 10 MG/ML (PF) SYRINGE
PREFILLED_SYRINGE | INTRAVENOUS | Status: DC | PRN
  Administered 2023-09-01: 10 mg via INTRAVENOUS
  Administered 2023-09-01: 60 mg via INTRAVENOUS

## 2023-09-01 MED ORDER — FENTANYL CITRATE (PF) 250 MCG/5ML IJ SOLN
INTRAMUSCULAR | Status: DC | PRN
  Administered 2023-09-01: 50 ug via INTRAVENOUS

## 2023-09-01 MED ORDER — CEFAZOLIN SODIUM-DEXTROSE 2-4 GM/100ML-% IV SOLN
2.0000 g | INTRAVENOUS | Status: AC
  Administered 2023-09-01: 2 g via INTRAVENOUS
  Filled 2023-09-01: qty 100

## 2023-09-01 SURGICAL SUPPLY — 21 items
BAG SNAP BAND KOVER 36X36 (MISCELLANEOUS) IMPLANT
BLANKET WARM UNDERBOD FULL ACC (MISCELLANEOUS) ×1 IMPLANT
CATH INFINITI 5FR ANG PIGTAIL (CATHETERS) IMPLANT
CATH NUVISION NON NAV ICE (CATHETERS) IMPLANT
CATH WATCHMAN STEER ACCESS SYS (CATHETERS) IMPLANT
CLOSURE PERCLOSE PROSTYLE (VASCULAR PRODUCTS) IMPLANT
COVER SWIFTLINK CONNECTOR (BAG) IMPLANT
DEVICE WATCHMAN FLX PRO PROC (KITS) IMPLANT
DEVICE WATCHMAN TRUSTEER PROC (KITS) IMPLANT
DILATOR VESSEL 38 20CM 16FR (INTRODUCER) IMPLANT
INTRODUCER PERFORM 12 30 .038 (SHEATH) IMPLANT
PACK CARDIAC CATHETERIZATION (CUSTOM PROCEDURE TRAY) ×1 IMPLANT
PAD DEFIB RADIO PHYSIO CONN (PAD) ×1 IMPLANT
SHEATH PERFORMER 18FRX30 (VASCULAR PRODUCTS) IMPLANT
SHEATH PINNACLE 8F 10CM (SHEATH) IMPLANT
SHEATH PROBE COVER 6X72 (BAG) ×1 IMPLANT
TRANSDUCER W/STOPCOCK (MISCELLANEOUS) ×1 IMPLANT
WATCHMAN FLX PRO 24 (Prosthesis & Implant Heart) IMPLANT
WATCHMAN FLX PRO PROCEDURE (KITS) ×1 IMPLANT
WATCHMAN TRUSTEER PROCEDURE (KITS) ×1 IMPLANT
WIRE HITORQ VERSACORE ST 145CM (WIRE) IMPLANT

## 2023-09-01 NOTE — Progress Notes (Signed)
 C/O 2/10 chest soreness. Dr. Marven Slimmer called and made aware.

## 2023-09-01 NOTE — Transfer of Care (Signed)
 Immediate Anesthesia Transfer of Care Note  Patient: [name]  Procedure(s) Performed: LEFT ATRIAL APPENDAGE OCCLUSION TRANSESOPHAGEAL ECHOCARDIOGRAM  Patient Location: PACU  Anesthesia Type:General  Level of Consciousness: awake and sedated  Airway & Oxygen Therapy: Patient Spontanous Breathing and Patient connected to face mask oxygen  Post-op Assessment: Report given to RN and Post -op Vital signs reviewed and stable  Post vital signs: Reviewed and stable  Last Vitals:  Vitals Value Taken Time  BP    Temp    Pulse 79 09/01/23 0927  Resp 12 09/01/23 0927  SpO2 99 % 09/01/23 0927  Vitals shown include unfiled device data.  Last Pain:  Vitals:   09/01/23 0624  TempSrc: Oral  PainSc:          Complications: No notable events documented.

## 2023-09-01 NOTE — Anesthesia Preprocedure Evaluation (Signed)
 Anesthesia Evaluation  Patient identified by MRN, date of birth, ID band Patient awake    Reviewed: Allergy & Precautions, NPO status , Patient's Chart, lab work & pertinent test results  History of Anesthesia Complications Negative for: history of anesthetic complications  Airway Mallampati: III  TM Distance: <3 FB Neck ROM: Full    Dental  (+) Teeth Intact, Dental Advisory Given   Pulmonary neg shortness of breath, neg sleep apnea, neg COPD, neg recent URI, former smoker   breath sounds clear to auscultation       Cardiovascular hypertension, Pt. on medications and Pt. on home beta blockers (-) angina (-) Past MI + dysrhythmias Atrial Fibrillation  Rhythm:Regular   1. Left ventricular ejection fraction, by estimation, is 45 to 50%. The  left ventricle has mildly decreased function. The left ventricle  demonstrates global hypokinesis. Left ventricular diastolic parameters are  consistent with Grade I diastolic  dysfunction (impaired relaxation). The average left ventricular global  longitudinal strain is -18.3 %. The global longitudinal strain is  abnormal.   2. Right ventricular systolic function is normal. The right ventricular  size is normal.   3. The mitral valve is normal in structure. Mild mitral valve  regurgitation. No evidence of mitral stenosis.   4. The aortic valve is tricuspid. Aortic valve regurgitation is not  visualized. Aortic valve sclerosis is present, with no evidence of aortic  valve stenosis.   5. The inferior vena cava is normal in size with greater than 50%  respiratory variability, suggesting right atrial pressure of 3 mmHg.     Neuro/Psych negative neurological ROS  negative psych ROS   GI/Hepatic negative GI ROS, Neg liver ROS,,,  Endo/Other  Hypothyroidism    Renal/GU Lab Results      Component                Value               Date                      NA                       142                  08/22/2023                K                        4.3                 08/22/2023                CO2                      25                  08/22/2023                GLUCOSE                  86                  08/22/2023                BUN  14                  08/22/2023                CREATININE               1.05 (H)            08/22/2023                CALCIUM                  9.9                 08/22/2023                EGFR                     54 (L)              08/22/2023                GFRNONAA                 >60                 01/12/2023                Musculoskeletal negative musculoskeletal ROS (+)    Abdominal   Peds  Hematology  (+) Blood dyscrasia Lab Results      Component                Value               Date                      WBC                      9.3                 08/22/2023                HGB                      14.6                08/22/2023                HCT                      43.7                08/22/2023                MCV                      95                  08/22/2023                PLT                      239                 08/22/2023             eliquis    Anesthesia Other Findings   Reproductive/Obstetrics  Anesthesia Physical Anesthesia Plan  ASA: 3  Anesthesia Plan: General   Post-op Pain Management:    Induction: Intravenous  PONV Risk Score and Plan: 3 and Ondansetron , Dexamethasone , Propofol  infusion and TIVA  Airway Management Planned: Oral ETT  Additional Equipment: None  Intra-op Plan:   Post-operative Plan: Extubation in OR  Informed Consent: I have reviewed the patients History and Physical, chart, labs and discussed the procedure including the risks, benefits and alternatives for the proposed anesthesia with the patient or authorized representative who has indicated his/her understanding and acceptance.     Dental  advisory given  Plan Discussed with: CRNA  Anesthesia Plan Comments:          Anesthesia Quick Evaluation

## 2023-09-01 NOTE — Discharge Summary (Addendum)
 [facility]    Patient ID: [name],  MRN: [id number], DOB/AGE: 09/17/1945 78 y.o.  Admit date: 09/01/2023 Discharge date: 09/01/2023  Primary Care Physician: Royden Ronal Czar, FNP  Primary Cardiologist: Stanly DELENA Leavens, MD  Electrophysiologist: OLE ONEIDA HOLTS, MD  Primary Discharge Diagnosis:  Paroxysmal Atrial Fibrillation Poor candidacy for long term anticoagulation due to  increase bleeding.   Procedures This Admission:  Transeptal Puncture Intra-procedural TEE which showed no LAA thrombus Left atrial appendage occlusive device placement on 09/01/23 by Dr. Holts.   This study demonstrated: CONCLUSIONS:  1.Successful implantation of a WATCHMAN left atrial appendage occlusive device    2. TEE demonstrating no LAA thrombus 3. ICE demonstrating no LAA thrombus, trivial pericardial effusion 4. No early apparent complications.    Post Implant Anticoagulation Strategy: Continue Eliquis  2.5mg  by mouth twice daily for 6 months after implant. After 6 months, stop Eliquis  and start aspirin  81mg  by mouth onc daily. Plan for CT scan 60 days after implant to assess appendage patency and Watchman position.  Brief HPI: [name] is a 78 y.o. female with a  history of HTN, HLD, mildly reduced EF (45-50%), PAF on low dose Eliquis  due to increased bleeding who presented to [facility] for elective LAAO.   [name] was initially prescribed full-strength Eliquis  but this was reduced by the patient to 2.5 mg BID because of bleeding in her hands causing severe swelling and pain. She was referred for consideration of LAAO with Watchman placement to avoid long term DOAC. Pre-procedure cardiac CT 02/07/23 showed a large chicken wing morphology LAA with amendable Watchman anatomy as well as a markedly elevated coronary calcium score. Subsequent PET CT with no evidence of ischemia.   Hospital Course:  The patient was admitted and underwent left atrial appendage occlusive  device placement with 24mm Watchman FLX Pro device.  She was monitored in the post procedure setting and has done very well with no concerns. Given this, she is being considered for same day discharge later today. Groin site has been stable without evidence of hematoma or bleeding. Wound care and restrictions were reviewed with the patient. The patient has been scheduled for post procedure follow up with a provider in approximately 1 month. She will restart  Eliquis  2.5mg  by mouth twice daily for 6 months after implant. After 6 months, stop Eliquis  and start aspirin  81mg  by mouth once daily. She will require dental SBE during this time and should refrain from dental work or cleanings for at least 30 days post implant. SBE to be RXd at follow up. A repeat CT will be performed in approximately 60 days to ensure proper seal of the device.   Physical Exam: Vitals:   09/01/23 1100 09/01/23 1114 09/01/23 1115 09/01/23 1130  BP: (!) 148/69 139/76 139/76 (!) 146/73  Pulse:  92    Resp:  16    Temp:  98.6 F (37 C)    TempSrc:  Oral    SpO2: 93% 93% 94% 93%  Weight:      Height:       Labs:   Lab Results  Component Value Date   WBC 9.3 08/22/2023   HGB 14.6 08/22/2023   HCT 43.7 08/22/2023   MCV 95 08/22/2023   PLT 239 08/22/2023   No results for input(s): NA, K, CL, CO2, BUN, CREATININE, CALCIUM, PROT, BILITOT, ALKPHOS, ALT, AST, GLUCOSE in the last 168 hours.  Invalid input(s): LABALBU  Discharge Medications:  Allergies  as of 09/01/2023       Reactions   Lincomycin Hcl    Other Reaction(s): Unknown   Chloramphenicol Rash        Medication List     TAKE these medications    amoxicillin 500 MG tablet Commonly known as: AMOXIL Take 2,000 mg by mouth as directed. For dental visits only   apixaban  2.5 MG Tabs tablet Commonly known as: Eliquis  Take 1 tablet (2.5 mg total) by mouth 2 (two) times daily. What changed:  medication strength how much to  take   atorvastatin 40 MG tablet Commonly known as: LIPITOR Take 20 mg by mouth daily.   Calcium Carbonate Antacid 400 MG Chew Chew 2 tablets by mouth daily. Ultra Chewable 400 mg.  Takes 2 daily.   cholecalciferol 1000 units tablet Commonly known as: VITAMIN D Take 1,000 Units by mouth daily.   CITRACAL + D PO Take 2 tablets by mouth daily.   CITRACAL +D3 PO Take 2 tablets by mouth daily.   Coenzyme Q10 200 MG capsule Take 200 mg by mouth daily.   diltiazem  30 MG tablet Commonly known as: Cardizem  Take 1 tablet (30 mg total) by mouth every 6 (six) hours as needed (palpitations).   hydrochlorothiazide 12.5 MG tablet Commonly known as: HYDRODIURIL Take 12.5 mg by mouth daily.   losartan 50 MG tablet Commonly known as: COZAAR Take 25 mg by mouth daily.   metoprolol  succinate 25 MG 24 hr tablet Commonly known as: TOPROL -XL TAKE 1 TABLET BY MOUTH DAILY.   multivitamin tablet Take 1 tablet by mouth daily. Nature Adult   Synthroid 88 MCG tablet Generic drug: levothyroxine Take 88 mcg by mouth every morning.       Disposition: Home  Discharge Instructions     Call MD for:  difficulty breathing, headache or visual disturbances   Complete by: As directed    Call MD for:  hives   Complete by: As directed    Call MD for:  persistant dizziness or light-headedness   Complete by: As directed    Call MD for:  persistant nausea and vomiting   Complete by: As directed    Call MD for:  redness, tenderness, or signs of infection (pain, swelling, redness, odor or green/yellow discharge around incision site)   Complete by: As directed    Call MD for:  severe uncontrolled pain   Complete by: As directed    Call MD for:  temperature >100.4   Complete by: As directed    Diet - low sodium heart healthy   Complete by: As directed    Discharge instructions   Complete by: As directed    [facility] Procedure, Care After  Procedure MD: Dr. Cindie Olds Clinical  Coordinator: Rockie Redman, RN  This sheet gives you information about how to care for yourself after your procedure. Your health care provider may also give you more specific instructions. If you have problems or questions, contact your health care provider.  What can I expect after the procedure? After the procedure, it is common to have: Bruising around your puncture site. Tenderness around your puncture site. Tiredness (fatigue).  Medication instructions It is very important to continue to take your blood thinner as directed by your doctor after the Watchman procedure. Call your procedure doctor's office with question or concerns. If you are on Coumadin (warfarin), you will have your INR checked the week after your procedure, with a goal INR of 2.0 - 3.0. Please follow your medication  instructions on your discharge summary. Only take the medications listed on your discharge paperwork.  Follow up You will be seen in 1 month after your procedure You will have a repeat CT scan approximately 8 weeks after your procedure mark to check your device You will follow up the MD/APP who performed your procedure 6 months after your procedure The Watchman Clinical Coordinator will check in with you from time to time, including 1 and 2 years after your procedure.    Follow these instructions at home: Puncture site care  Follow instructions from your health care provider about how to take care of your puncture site. Make sure you: If present, leave stitches (sutures), skin glue, or adhesive strips in place.  If a large square bandage is present, this may be removed 24 hours after surgery.  Check your puncture site every day for signs of infection. Check for: Redness, swelling, or pain. Fluid or blood. If your puncture site starts to bleed, lie down on your back, apply firm pressure to the area, and contact your health care provider. Warmth. Pus or a bad smell. Driving Do not drive yourself home if  you received sedation Do not drive for at least 4 days after your procedure or however long your health care provider recommends. (Do not resume driving if you have previously been instructed not to drive for other health reasons.) Do not spend greater than 1 hour at a time in a car for the first 3 days. Stop and take a break with a 5 minute walk at least every hour.  Do not drive or use heavy machinery while taking prescription pain medicine.  Activity Avoid activities that take a lot of effort, including exercise, for at least 7 days after your procedure. For the first 3 days, avoid sitting for longer than one hour at a time.  Avoid alcoholic beverages, signing paperwork, or participating in legal proceedings for 24 hours after receiving sedation Do not lift anything that is heavier than 10 lb (4.5 kg) for one week.  No sexual activity for 1 week.  Return to your normal activities as told by your health care provider. Ask your health care provider what activities are safe for you. General instructions Take over-the-counter and prescription medicines only as told by your health care provider. Do not use any products that contain nicotine or tobacco, such as cigarettes and e-cigarettes. If you need help quitting, ask your health care provider. You may shower after 24 hours, but Do not take baths, swim, or use a hot tub for 1 week.  Do not drink alcohol for 24 hours after your procedure. Keep all follow-up visits as told by your health care provider. This is important. Dental Work: You will require antibiotics prior to any dental work, including cleanings, for 6 months after your Watchman implantation to help protect you from infection. After 6 months, antibiotics are no longer required. Contact a health care provider if: You have redness, mild swelling, or pain around your puncture site. You have soreness in your throat or at your puncture site that does not improve after several days You have  fluid or blood coming from your puncture site that stops after applying firm pressure to the area. Your puncture site feels warm to the touch. You have pus or a bad smell coming from your puncture site. You have a fever. You have chest pain or discomfort that spreads to your neck, jaw, or arm. You are sweating a lot. You feel nauseous.  You have a fast or irregular heartbeat. You have shortness of breath. You are dizzy or light-headed and feel the need to lie down. You have pain or numbness in the arm or leg closest to your puncture site. Get help right away if: Your puncture site suddenly swells. Your puncture site is bleeding and the bleeding does not stop after applying firm pressure to the area. These symptoms may represent a serious problem that is an emergency. Do not wait to see if the symptoms will go away. Get medical help right away. Call your local emergency services (911 in the [place] Do not drive yourself to the hospital. Summary After the procedure, it is normal to have bruising and tenderness at the puncture site in your groin, neck, or forearm. Check your puncture site every day for signs of infection. Get help right away if your puncture site is bleeding and the bleeding does not stop after applying firm pressure to the area. This is a medical emergency.  This information is not intended to replace advice given to you by your health care provider. Make sure you discuss any questions you have with your health care provider.   Increase activity slowly   Complete by: As directed        Follow-up Information     Terra Fairy PARAS, PA-C Follow up on 10/05/2023.   Specialty: Cardiology Why: Your follow up appointment is at 10:30AM. Please plan to arrive by 10:15AM. Contact information: [address] [place] [address] [phone]                 Duration of Discharge Encounter:  APP Time: 45 minutes   Signed, Kate Minus, NP  09/01/2023 2:44 PM

## 2023-09-01 NOTE — Progress Notes (Signed)
  Echocardiogram Echocardiogram Transesophageal has been performed.  [name] 09/01/2023, 9:33 AM

## 2023-09-01 NOTE — Anesthesia Procedure Notes (Signed)
 Procedure Name: Intubation Date/Time: 09/01/2023 7:53 AM  Performed by: Cindie Ronal POUR, CRNAPre-anesthesia Checklist: Patient identified, Emergency Drugs available, Suction available and Patient being monitored Patient Re-evaluated:Patient Re-evaluated prior to induction Oxygen Delivery Method: Circle System Utilized Preoxygenation: Pre-oxygenation with 100% oxygen Induction Type: IV induction Ventilation: Mask ventilation without difficulty Laryngoscope Size: Miller and 2 Grade View: Grade II Tube type: Oral Tube size: 7.0 mm Number of attempts: 1 Airway Equipment and Method: Stylet Placement Confirmation: ETT inserted through vocal cords under direct vision, positive ETCO2 and breath sounds checked- equal and bilateral Secured at: 22 cm Tube secured with: Tape Dental Injury: Teeth and Oropharynx as per pre-operative assessment

## 2023-09-01 NOTE — Discharge Instructions (Signed)
 [facility] Procedure, Care After  Procedure MD: Dr. Isidoro Donning Clinical Coordinator: Karsten Fells, RN  This sheet gives you information about how to care for yourself after your procedure. Your health care provider may also give you more specific instructions. If you have problems or questions, contact your health care provider.  What can I expect after the procedure? After the procedure, it is common to have: Bruising around your puncture site. Tenderness around your puncture site. Tiredness (fatigue).  Medication instructions It is very important to continue to take your blood thinner as directed by your doctor after the Watchman procedure. Call your procedure doctor's office with question or concerns. If you are on Coumadin (warfarin), you will have your INR checked the week after your procedure, with a goal INR of 2.0 - 3.0. Please follow your medication instructions on your discharge summary. Only take the medications listed on your discharge paperwork.  Follow up You will be seen in 1 month after your procedure  You will have a repeat CT scan approximately 8 weeks after your procedure mark to check your device You will follow up the MD/APP who performed your procedure 6 months after your procedure The Watchman Clinical Coordinator will check in with you from time to time, including 1 and 2 years after your procedure.    Follow these instructions at home: Puncture site care  Follow instructions from your health care provider about how to take care of your puncture site. Make sure you: If present, leave stitches (sutures), skin glue, or adhesive strips in place.  If a large square bandage is present, this may be removed 24 hours after surgery.  Check your puncture site every day for signs of infection. Check for: Redness, swelling, or pain. Fluid or blood. If your puncture site starts to bleed, lie down on your back, apply firm pressure to the area, and contact your health  care provider. Warmth. Pus or a bad smell. Driving Do not drive yourself home if you received sedation Do not drive for at least 4 days after your procedure or however long your health care provider recommends. (Do not resume driving if you have previously been instructed not to drive for other health reasons.) Do not spend greater than 1 hour at a time in a car for the first 3 days. Stop and take a break with a 5 minute walk at least every hour.  Do not drive or use heavy machinery while taking prescription pain medicine.  Activity Avoid activities that take a lot of effort, including exercise, for at least 7 days after your procedure. For the first 3 days, avoid sitting for longer than one hour at a time.  Avoid alcoholic beverages, signing paperwork, or participating in legal proceedings for 24 hours after receiving sedation Do not lift anything that is heavier than 10 lb (4.5 kg) for one week.  No sexual activity for 1 week.  Return to your normal activities as told by your health care provider. Ask your health care provider what activities are safe for you. General instructions Take over-the-counter and prescription medicines only as told by your health care provider. Do not use any products that contain nicotine or tobacco, such as cigarettes and e-cigarettes. If you need help quitting, ask your health care provider. You may shower after 24 hours, but Do not take baths, swim, or use a hot tub for 1 week.  Do not drink alcohol for 24 hours after your procedure. Keep all follow-up visits as told by  your health care provider. This is important. Dental Work: You will require antibiotics prior to any dental work, including cleanings, for 6 months after your Watchman implantation to help protect you from infection. After 6 months, antibiotics are no longer required. Contact a health care provider if: You have redness, mild swelling, or pain around your puncture site. You have soreness in  your throat or at your puncture site that does not improve after several days You have fluid or blood coming from your puncture site that stops after applying firm pressure to the area. Your puncture site feels warm to the touch. You have pus or a bad smell coming from your puncture site. You have a fever. You have chest pain or discomfort that spreads to your neck, jaw, or arm. You are sweating a lot. You feel nauseous. You have a fast or irregular heartbeat. You have shortness of breath. You are dizzy or light-headed and feel the need to lie down. You have pain or numbness in the arm or leg closest to your puncture site. Get help right away if: Your puncture site suddenly swells. Your puncture site is bleeding and the bleeding does not stop after applying firm pressure to the area. These symptoms may represent a serious problem that is an emergency. Do not wait to see if the symptoms will go away. Get medical help right away. Call your local emergency services (911 in the [place] Do not drive yourself to the hospital. Summary After the procedure, it is normal to have bruising and tenderness at the puncture site in your groin, neck, or forearm. Check your puncture site every day for signs of infection. Get help right away if your puncture site is bleeding and the bleeding does not stop after applying firm pressure to the area. This is a medical emergency.  This information is not intended to replace advice given to you by your health care provider. Make sure you discuss any questions you have with your health care provider.

## 2023-09-01 NOTE — H&P (Signed)
 Electrophysiology Office Note:     Date:  09/01/2023    ID:  [name], DOB 30-Mar-1946, MRN [id number]   [facility] Cardiologist:  Stanly DELENA Leavens, MD  [facility] Electrophysiologist:  OLE ONEIDA HOLTS, MD    Referring MD: Royden Ronal Czar, FNP    Chief Complaint: Atrial fibrillation   History of Present Illness:     [name] is a 78 y.o. female who I am seeing today for an evaluation of atrial fibrillation at the request of Dr. Leavens.  The patient last saw Dr. Leavens Dec 06, 2022.  The patient has a history of paroxysmal atrial fibrillation with the last episode occurring in November 2023.  She also has a history of hypertension and hyperlipidemia.  She was initially prescribed full-strength Eliquis  but this was reduced by the patient to 2.5 mg by mouth twice daily because of bleeding in her hands causing severe swelling and pain.  She has had no further issues on half dose Eliquis .  She is in clinic today with her husband.  June 15, 2022 echo EF 45-50 RV function normal Mild MR    Presents for Watchman implant today under TEE/ICE guidance. Procedure reviewed.         Objective Their past medical, social and family history was reveiwed.     ROS:   Please see the history of present illness.    All other systems reviewed and are negative.   EKGs/Labs/Other Studies Reviewed:     The following studies were reviewed today:   May 30, 2022 EKG shows atrial fibrillation with a rapid ventricular rate       EKG:  The ekg ordered today demonstrates sinus rhythm     Physical Exam:     VS:  BP 149/82 (BP Location: Left Arm, Patient Position: Sitting, Cuff Size: Large)   Pulse 75   Ht 5' 6.5 (1.689 m)   Wt 191 lb 4 oz (86.8 kg)   SpO2 95%   BMI 30.41 kg/m         Wt Readings from Last 3 Encounters:  01/12/23 191 lb 4 oz (86.8 kg)  12/06/22 193 lb 12.8 oz (87.9 kg)  06/01/22 188 lb (85.3 kg)      GEN:  Well nourished,  well developed in no acute distress CARDIAC: RRR, no murmurs, rubs, gallops RESPIRATORY:  Clear to auscultation without rales, wheezing or rhonchi      Assessment ASSESSMENT AND PLAN:     1. Paroxysmal atrial fibrillation (HCC)   2. Bleeding   3. Primary hypertension       #Paroxysmal atrial fibrillation Low burden of arrhythmia.  Currently on subtherapeutic dose of Eliquis .  We discussed the indicated dose being 5 mg by mouth twice daily but given the patient's trouble with bleeding and swelling in her hands she has reduced it to 2.5 mg by mouth twice daily.  We discussed left atrial appendage occlusion as another mechanism to reduce her stroke risk associated with atrial fibrillation and she is interested in proceeding.  Around the time of implant, plan to use 2.5 mg by mouth twice daily of Eliquis  for at least 6 months post implant.  We discussed the data supporting Eliquis  2.5 twice daily versus DAPT versus full-strength Eliquis .  She understands that this is not supported in the guidelines although she is strongly in favor of avoiding full-strength Eliquis  around the time of implant.   ----------------------   I have seen [name] in the  office today who is being considered for a Watchman left atrial appendage closure device. I believe they will benefit from this procedure given their history of atrial fibrillation, CHA2DS2-VASc score of 4 and unadjusted ischemic stroke rate of 4.8% per year. Unfortunately, the patient is not felt to be a long term anticoagulation candidate secondary to bleeding and swelling involving her hands.  She is also not using therapeutic doses of anticoagulation at this time. The patient's chart has been reviewed and I feel that they would be a candidate for short term oral anticoagulation after Watchman implant.    It is my belief that after undergoing a LAA closure procedure, [name] will not need long term anticoagulation which eliminates  anticoagulation side effects and major bleeding risk.    Procedural risks for the Watchman implant have been reviewed with the patient including a 0.5% risk of stroke, <1% risk of perforation and <1% risk of device embolization. Other risks include bleeding, vascular damage, tamponade, worsening renal function, and death. The patient understands these risk and wishes to proceed.       The published clinical data on the safety and effectiveness of WATCHMAN include but are not limited to the following: - Holmes DR, Jess BEARD, Sick P et al. for the PROTECT AF Investigators. Percutaneous closure of the left atrial appendage versus warfarin therapy for prevention of stroke in patients with atrial fibrillation: a randomised non-inferiority trial. Lancet 2009; 374: 534-42. GLENWOOD Jess BEARD, Doshi SK, Jonita VEAR Satchel D et al. on behalf of the PROTECT AF Investigators. Percutaneous Left Atrial Appendage Closure for Stroke Prophylaxis in Patients With Atrial Fibrillation 2.3-Year Follow-up of the PROTECT AF (Watchman Left Atrial Appendage System for Embolic Protection in Patients With Atrial Fibrillation) Trial. Circulation 2013; 127:720-729. - Alli O, Doshi S,  Kar S, Reddy VY, Sievert H et al. Quality of Life Assessment in the Randomized PROTECT AF (Percutaneous Closure of the Left Atrial Appendage Versus Warfarin Therapy for Prevention of Stroke in Patients With Atrial Fibrillation) Trial of Patients at Risk for Stroke With Nonvalvular Atrial Fibrillation. J Am Coll Cardiol 2013; 61:1790-8. GLENWOOD Satchel DR, Archer GORMAN, Price M, Whisenant B, Sievert H, Doshi S, Huber K, Reddy V. Prospective randomized evaluation of the Watchman left atrial appendage Device in patients with atrial fibrillation versus long-term warfarin therapy; the PREVAIL trial. Journal of the [employer] of Cardiology, Vol. 4, No. 1, 2014, 1-11. - Kar S, Doshi SK, Sadhu A, Horton R, Osorio J et al. Primary outcome evaluation of a next-generation left  atrial appendage closure device: results from the PINNACLE FLX trial. Circulation 2021;143(18)1754-1762.      After today's visit with the patient which was dedicated solely for shared decision making visit regarding LAA closure device, the patient decided to proceed with the LAA appendage closure procedure scheduled to be done in the near future at [facility]. Prior to the procedure, I would like to obtain a gated CT scan of the chest with contrast timed for PV/LA visualization.      HAS-BLED score 3 Hypertension Yes  Abnormal renal and liver function (Dialysis, transplant, Cr >2.26 mg/dL /Cirrhosis or Bilirubin >2x Normal or AST/ALT/AP >3x Normal) No  Stroke No  Bleeding Yes  Labile INR (Unstable/high INR) No  Elderly (>65) Yes  Drugs or alcohol (>= 8 drinks/week, anti-plt or NSAID) No    CHA2DS2-VASc Score = 4  The patient's score is based upon: CHF History: 0 HTN History: 1 Diabetes History: 0 Stroke History: 0  Vascular Disease History: 0 Age Score: 2 Gender Score: 1   #Hypertension At goal today.  Recommend checking blood pressures 1-2 times per week at home and recording the values.  Recommend bringing these recordings to the primary care physician.    Presents for [employer] implant today under TEE/ICE guidance. Procedure reviewed.     Signed, Ole DASEN. Cindie, MD, [facility], [facility] 09/01/2023 Electrophysiology [facility]

## 2023-09-02 ENCOUNTER — Ambulatory Visit: Payer: Medicare HMO | Admitting: Internal Medicine

## 2023-09-02 ENCOUNTER — Telehealth: Payer: Self-pay

## 2023-09-02 ENCOUNTER — Encounter (HOSPITAL_COMMUNITY): Payer: Self-pay | Admitting: Cardiology

## 2023-09-02 DIAGNOSIS — I480 Paroxysmal atrial fibrillation: Secondary | ICD-10-CM

## 2023-09-02 DIAGNOSIS — Z95818 Presence of other cardiac implants and grafts: Secondary | ICD-10-CM

## 2023-09-02 MED FILL — Fentanyl Citrate Soln Prefilled Syringe 100 MCG/2ML: INTRAMUSCULAR | Qty: 1 | Status: AC

## 2023-09-02 NOTE — Telephone Encounter (Signed)
  HEART AND VASCULAR CENTER   Watchman Team  Contacted the patient regarding discharge from [facility] on 09/01/2023  The patient understands to follow up with Izetta Hummer, PA on 10/03/2023  The patient understands discharge instructions? Yes  The patient understands medications and regimen? Yes   The patient reports groin site looks healthy with no S/S of bleeding or infection.  The patient understands to call with any questions or concerns prior to scheduled visit.

## 2023-09-02 NOTE — Anesthesia Postprocedure Evaluation (Signed)
 Anesthesia Post Note  Patient: [name]  Procedure(s) Performed: LEFT ATRIAL APPENDAGE OCCLUSION TRANSESOPHAGEAL ECHOCARDIOGRAM     Patient location during evaluation: Cath Lab Anesthesia Type: General Level of consciousness: awake and alert Pain management: pain level controlled Vital Signs Assessment: post-procedure vital signs reviewed and stable Respiratory status: spontaneous breathing, nonlabored ventilation and respiratory function stable Cardiovascular status: blood pressure returned to baseline and stable Postop Assessment: no apparent nausea or vomiting Anesthetic complications: no   No notable events documented.                Ruie Sendejo

## 2023-09-26 DIAGNOSIS — Z0000 Encounter for general adult medical examination without abnormal findings: Secondary | ICD-10-CM | POA: Diagnosis not present

## 2023-09-26 DIAGNOSIS — E039 Hypothyroidism, unspecified: Secondary | ICD-10-CM | POA: Diagnosis not present

## 2023-09-26 DIAGNOSIS — E559 Vitamin D deficiency, unspecified: Secondary | ICD-10-CM | POA: Diagnosis not present

## 2023-09-26 DIAGNOSIS — E7800 Pure hypercholesterolemia, unspecified: Secondary | ICD-10-CM | POA: Diagnosis not present

## 2023-09-26 DIAGNOSIS — I10 Essential (primary) hypertension: Secondary | ICD-10-CM | POA: Diagnosis not present

## 2023-10-03 ENCOUNTER — Ambulatory Visit: Payer: Medicare HMO | Attending: Physician Assistant | Admitting: Physician Assistant

## 2023-10-03 ENCOUNTER — Other Ambulatory Visit: Payer: Self-pay | Admitting: *Deleted

## 2023-10-03 VITALS — BP 112/66 | HR 87 | Ht 66.0 in | Wt 186.6 lb

## 2023-10-03 DIAGNOSIS — Z95818 Presence of other cardiac implants and grafts: Secondary | ICD-10-CM

## 2023-10-03 DIAGNOSIS — M8589 Other specified disorders of bone density and structure, multiple sites: Secondary | ICD-10-CM | POA: Diagnosis not present

## 2023-10-03 DIAGNOSIS — I480 Paroxysmal atrial fibrillation: Secondary | ICD-10-CM | POA: Diagnosis not present

## 2023-10-03 DIAGNOSIS — M85852 Other specified disorders of bone density and structure, left thigh: Secondary | ICD-10-CM | POA: Diagnosis not present

## 2023-10-03 DIAGNOSIS — M85851 Other specified disorders of bone density and structure, right thigh: Secondary | ICD-10-CM | POA: Diagnosis not present

## 2023-10-03 DIAGNOSIS — I10 Essential (primary) hypertension: Secondary | ICD-10-CM

## 2023-10-03 DIAGNOSIS — E039 Hypothyroidism, unspecified: Secondary | ICD-10-CM | POA: Diagnosis not present

## 2023-10-03 DIAGNOSIS — E7800 Pure hypercholesterolemia, unspecified: Secondary | ICD-10-CM | POA: Diagnosis not present

## 2023-10-03 DIAGNOSIS — R931 Abnormal findings on diagnostic imaging of heart and coronary circulation: Secondary | ICD-10-CM | POA: Diagnosis not present

## 2023-10-03 DIAGNOSIS — Z0000 Encounter for general adult medical examination without abnormal findings: Secondary | ICD-10-CM | POA: Diagnosis not present

## 2023-10-03 NOTE — Patient Instructions (Signed)
 Medication Instructions:  Your physician recommends that you continue on your current medications as directed. Please refer to the Current Medication list given to you today.  *If you need a refill on your cardiac medications before your next appointment, please call your pharmacy*   Lab Work: TODAY:  BMET  If you have labs (blood work) drawn today and your tests are completely normal, you will receive your results only by: MyChart Message (if you have MyChart) OR A paper copy in the mail If you have any lab test that is abnormal or we need to change your treatment, we will call you to review the results.   Testing/Procedures: None ordered   Follow-Up: At [facility], you and your health needs are our priority.  As part of our continuing mission to provide you with exceptional heart care, we have created designated Provider Care Teams.  These Care Teams include your primary Cardiologist (physician) and Advanced Practice Providers (APPs -  Physician Assistants and Nurse Practitioners) who all work together to provide you with the care you need, when you need it.  We recommend signing up for the patient portal called "MyChart".  Sign up information is provided on this After Visit Summary.  MyChart is used to connect with patients for Virtual Visits (Telemedicine).  Patients are able to view lab/test results, encounter notes, upcoming appointments, etc.  Non-urgent messages can be sent to your provider as well.   To learn more about what you can do with MyChart, go to [email or web address].    Your next appointment:   As planned   Provider:   Christell Constant, MD     Other Instructions     1st Floor: - Lobby - Registration  - Pharmacy  - Lab - Cafe  2nd Floor: - PV Lab - Diagnostic Testing (echo, CT, nuclear med)  3rd Floor: - Vacant  4th Floor: - TCTS (cardiothoracic surgery) - AFib Clinic - Structural Heart Clinic - Vascular Surgery  - Vascular  Ultrasound  5th Floor: - HeartCare Cardiology (general and EP) - Clinical Pharmacy for coumadin, hypertension, lipid, weight-loss medications, and med management appointments    Valet parking services will be available as well.

## 2023-10-03 NOTE — Progress Notes (Signed)
 HEART AND VASCULAR CENTER   MULTIDISCIPLINARY HEART VALVE CLINIC                                     Cardiology Office Note:    Date:  10/03/2023   ID:  [name], DOB January 01, 1946, MRN [id number]  PCP:  Samantha Sanger, FNP  [facility] Cardiologist:  Samantha Constant, MD  [facility] Electrophysiologist:  Samantha Prude, MD   Referring MD: Samantha Sanger, FNP   1 month s/p LAAO closure   History of Present Illness:    [name] is a 78 y.o. female with a hx of HTN, HLD, mildly reduced EF (45-50%), elevated CAC score, and PAF s/p LAAO closure (09/01/23) who presents to clinic for follow up.   The patient has a history of paroxysmal atrial fibrillation with the last episode occurring in 05/2022. She was initially prescribed full-strength Eliquis but this was reduced by the patient to 2.5 mg BID because of bleeding in her hands causing severe swelling and pain. She has had no further issues on half dose Eliquis. She was referred for LAAO with Watchman and seen by Dr. Lalla Fuller. 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 Follow up PET scan on 05/24/23 was low risk. She underwent LAAO with a 24mm Watchman FLX Pro device on 09/01/23 by Dr. Lalla Fuller. She was discharged on Eliquis 2.5mg  BID x 6 months with plans to continue this for 6 months post implant and then start aspirin 81mg  daily.   Today the patient presents to clinic for follow up. Here alone. 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 No CP or SOB. No LE edema, orthopnea or PND. No dizziness or syncope. No blood in stool or urine. No palpitations.    Past Medical History:  Diagnosis Date   Cataract    bilateral -removed   History of atrial fibrillation    "15-20 years ago"   Hyperlipidemia    Hypertension     Presence of Watchman left atrial appendage closure device 09/01/2023   24mm Watchman FLX Pro placed by Dr. Lalla Fuller   Skin cancer    melanoma and basal cell and squamous cell   Thyroid disease    doesnt have thyroid    Past Surgical History:  Procedure Laterality Date   ABDOMINAL HYSTERECTOMY     has ovaries   CATARACT EXTRACTION, BILATERAL     COLONOSCOPY     DILATION AND CURETTAGE OF UTERUS     FOOT MASS EXCISION     LEFT ATRIAL APPENDAGE OCCLUSION N/A 09/01/2023   Procedure: LEFT ATRIAL APPENDAGE OCCLUSION;  Surgeon: Samantha Prude, MD;  Location: [facility] INVASIVE CV LAB;  Service: Cardiovascular;  Laterality: N/A;   MELANOMA EXCISION     POLYPECTOMY     TRANSESOPHAGEAL ECHOCARDIOGRAM (CATH LAB) N/A 09/01/2023   Procedure: TRANSESOPHAGEAL ECHOCARDIOGRAM;  Surgeon: Samantha Prude, MD;  Location: [facility] INVASIVE CV LAB;  Service: Cardiovascular;  Laterality: N/A;    Current Medications: Current Meds  Medication Sig   amoxicillin (AMOXIL) 500 MG tablet Take 2,000 mg by mouth as directed. For dental visits only   apixaban (ELIQUIS) 2.5 MG TABS tablet Take 1 tablet (2.5 mg total) by  mouth 2 (two) times daily.   atorvastatin (LIPITOR) 40 MG tablet Take 20 mg by mouth daily.   Calcium Citrate-Vitamin D (CITRACAL + D PO) Take 2 tablets by mouth daily.   Calcium-Phosphorus-Vitamin D (CITRACAL +D3 PO) Take 2 tablets by mouth daily.   cholecalciferol (VITAMIN D) 1000 units tablet Take 1,000 Units by mouth daily.   Coenzyme Q10 200 MG capsule Take 200 mg by mouth daily.   diltiazem (CARDIZEM) 30 MG tablet Take 1 tablet (30 mg total) by mouth every 6 (six) hours as needed (palpitations).   hydrochlorothiazide (HYDRODIURIL) 12.5 MG tablet Take 12.5 mg by mouth daily.   losartan (COZAAR) 50 MG tablet Take 25 mg by mouth daily.   metoprolol succinate (TOPROL-XL) 25 MG 24 hr tablet TAKE 1 TABLET BY MOUTH DAILY.   SYNTHROID 88 MCG tablet Take 88 mcg by mouth every morning.      ROS:   Please  see the history of present illness.    All other systems reviewed and are negative.  EKGs   EKG:  EKG is NOT ordered today.   Risk Assessment/Calculations:    HAS-BLED score 3 Hypertension Yes  Abnormal renal and liver function (Dialysis, transplant, Cr >2.26 mg/dL /Cirrhosis or Bilirubin >2x Normal or AST/ALT/AP >3x Normal) No  Stroke No  Bleeding Yes  Labile INR (Unstable/high INR) No  Elderly (>65) Yes  Drugs or alcohol (>= 8 drinks/week, anti-plt or NSAID) No    CHA2DS2-VASc Score = 4  The patient's score is based upon: CHF History: 0 HTN History: 1 Diabetes History: 0 Stroke History: 0 Vascular Disease History: 0 Age Score: 2 Gender Score: 1   Physical Exam:    VS:  BP 112/66   Pulse 87   Ht 5\' 6"  (1.676 m)   Wt 186 lb 9.6 oz (84.6 kg)   SpO2 95%   BMI 30.12 kg/m     Wt Readings from Last 3 Encounters:  10/03/23 186 lb 9.6 oz (84.6 kg)  09/01/23 188 lb (85.3 kg)  08/22/23 188 lb 12.8 oz (85.6 kg)     GEN: Well nourished, well developed in no acute distress NECK: No JVD; No carotid bruits CARDIAC: RRR, no murmurs, rubs, gallops RESPIRATORY:  Clear to auscultation without rales, wheezing or rhonchi  ABDOMEN: Soft, non-tender, non-distended EXTREMITIES:  No edema; No deformity   ASSESSMENT:    1. Presence of Watchman left atrial appendage closure device   2. Paroxysmal atrial fibrillation (HCC)   3. Essential hypertension   4. Elevated coronary artery calcium score     PLAN:    In order of problems listed above:  PAF s/p LAAO with Watchman:  -- Continue Toprol XL 25mg  daily.  -- Continue Eliquis 2.5mg  BID x 6 months (02/29/24) and then convert to a baby aspirin 81mg .  -- Has PRN diltiazem 30mg  for palpitations.  -- CT scan set up for 11/07/23 to assess appendage patency and Watchman position.  -- BMET ordered today.   HTN:  -- BP well controlled.  -- Continue Toprol XL 25mg  daily. -- Continue Losartan 25 mg daily  -- Continue  hydrochlorothiazide 12.5mg  daily.   Elevated CAC score:   -- Noted on pre Wathcman planning cardiac CT.  -- CAC score 2774, which is 98th %for age and sex matched peers.  -- Follow up PET stress scan was low risk.  -- Continue Lipitor 40mg  daily.  -- Plan destination therapy with aspirin 81 mg daily after taken of OAC.   Medication Adjustments/Labs  and Tests Ordered: Current medicines are reviewed at length with the patient today.  Concerns regarding medicines are outlined above.  Orders Placed This Encounter  Procedures   Basic Metabolic Panel (BMET)   No orders of the defined types were placed in this encounter.   Patient Instructions  Medication Instructions:  Your physician recommends that you continue on your current medications as directed. Please refer to the Current Medication list given to you today.  *If you need a refill on your cardiac medications before your next appointment, please call your pharmacy*   Lab Work: TODAY:  BMET  If you have labs (blood work) drawn today and your tests are completely normal, you will receive your results only by: MyChart Message (if you have MyChart) OR A paper copy in the mail If you have any lab test that is abnormal or we need to change your treatment, we will call you to review the results.   Testing/Procedures: None ordered   Follow-Up: At [facility], you and your health needs are our priority.  As part of our continuing mission to provide you with exceptional heart care, we have created designated Provider Care Teams.  These Care Teams include your primary Cardiologist (physician) and Advanced Practice Providers (APPs -  Physician Assistants and Nurse Practitioners) who all work together to provide you with the care you need, when you need it.  We recommend signing up for the patient portal called "MyChart".  Sign up information is provided on this After Visit Summary.  MyChart is used to connect with patients for  Virtual Visits (Telemedicine).  Patients are able to view lab/test results, encounter notes, upcoming appointments, etc.  Non-urgent messages can be sent to your provider as well.   To learn more about what you can do with MyChart, go to [email or web address].    Your next appointment:   As planned   Provider:   Christell Constant, MD     Other Instructions     1st Floor: - Lobby - Registration  - Pharmacy  - Lab - Cafe  2nd Floor: - PV Lab - Diagnostic Testing (echo, CT, nuclear med)  3rd Floor: - Vacant  4th Floor: - TCTS (cardiothoracic surgery) - AFib Clinic - Structural Heart Clinic - Vascular Surgery  - Vascular Ultrasound  5th Floor: - HeartCare Cardiology (general and EP) - Clinical Pharmacy for coumadin, hypertension, lipid, weight-loss medications, and med management appointments    Valet parking services will be available as well.         Signed, Cline Crock, PA-C  10/03/2023 12:23 PM    [facility]

## 2023-10-05 ENCOUNTER — Ambulatory Visit (HOSPITAL_COMMUNITY): Payer: Medicare HMO | Admitting: Internal Medicine

## 2023-10-18 DIAGNOSIS — I480 Paroxysmal atrial fibrillation: Secondary | ICD-10-CM | POA: Diagnosis not present

## 2023-10-19 LAB — BASIC METABOLIC PANEL
BUN/Creatinine Ratio: 12 (ref 12–28)
BUN: 11 mg/dL (ref 8–27)
CO2: 27 mmol/L (ref 20–29)
Calcium: 9.3 mg/dL (ref 8.7–10.3)
Chloride: 103 mmol/L (ref 96–106)
Creatinine, Ser: 0.94 mg/dL (ref 0.57–1.00)
Glucose: 109 mg/dL — ABNORMAL HIGH (ref 70–99)
Potassium: 3.8 mmol/L (ref 3.5–5.2)
Sodium: 145 mmol/L — ABNORMAL HIGH (ref 134–144)
eGFR: 62 mL/min/{1.73_m2} (ref >59)

## 2023-11-07 ENCOUNTER — Ambulatory Visit (HOSPITAL_COMMUNITY)
Admission: RE | Admit: 2023-11-07 | Discharge: 2023-11-07 | Disposition: A | Discharge status: Home / Self Care | Payer: Medicare HMO | Source: Ambulatory Visit | Attending: Registered Nurse | Admitting: Registered Nurse

## 2023-11-07 DIAGNOSIS — I480 Paroxysmal atrial fibrillation: Secondary | ICD-10-CM | POA: Diagnosis not present

## 2023-11-07 DIAGNOSIS — Z95818 Presence of other cardiac implants and grafts: Secondary | ICD-10-CM | POA: Diagnosis not present

## 2023-11-07 MED ORDER — IOHEXOL 350 MG/ML SOLN
95.0000 mL | Freq: Once | INTRAVENOUS | Status: AC | PRN
  Administered 2023-11-07: 95 mL via INTRAVENOUS

## 2023-11-08 ENCOUNTER — Encounter: Payer: Self-pay | Admitting: Cardiology

## 2023-12-01 DIAGNOSIS — L570 Actinic keratosis: Secondary | ICD-10-CM | POA: Diagnosis not present

## 2023-12-01 DIAGNOSIS — Z85820 Personal history of malignant melanoma of skin: Secondary | ICD-10-CM | POA: Diagnosis not present

## 2023-12-01 DIAGNOSIS — L821 Other seborrheic keratosis: Secondary | ICD-10-CM | POA: Diagnosis not present

## 2023-12-01 DIAGNOSIS — Z85828 Personal history of other malignant neoplasm of skin: Secondary | ICD-10-CM | POA: Diagnosis not present

## 2023-12-01 DIAGNOSIS — L853 Xerosis cutis: Secondary | ICD-10-CM | POA: Diagnosis not present

## 2023-12-01 DIAGNOSIS — L814 Other melanin hyperpigmentation: Secondary | ICD-10-CM | POA: Diagnosis not present

## 2023-12-01 DIAGNOSIS — D1801 Hemangioma of skin and subcutaneous tissue: Secondary | ICD-10-CM | POA: Diagnosis not present

## 2023-12-01 DIAGNOSIS — L720 Epidermal cyst: Secondary | ICD-10-CM | POA: Diagnosis not present

## 2024-01-24 ENCOUNTER — Telehealth: Payer: Self-pay | Admitting: Internal Medicine

## 2024-01-24 DIAGNOSIS — I480 Paroxysmal atrial fibrillation: Secondary | ICD-10-CM

## 2024-01-24 MED ORDER — APIXABAN 2.5 MG PO TABS: 2.5000 mg | ORAL_TABLET | Freq: Two times a day (BID) | ORAL | 1 refills | Status: DC

## 2024-01-24 NOTE — Telephone Encounter (Signed)
*  STAT* If patient is at the pharmacy, call can be transferred to refill team.   1. Which medications need to be refilled? (please list name of each medication and dose if known) apixaban  (ELIQUIS ) 2.5 MG TABS tablet    2. Would you like to learn more about the convenience, safety, & potential cost savings by using the [facility]?      3. Are you open to using the [facility] (Type [facility]. ).   4. Which pharmacy/location (including street and city if local pharmacy) is medication to be sent to?  [facility] - [place], [place] - [address]     5. Do they need a 30 day or 90 day supply? 30 day

## 2024-01-24 NOTE — Telephone Encounter (Signed)
 Eliquis  2.5mg  refill request received for 30 day supply Patient is 78 years old, weight-84.6kg, Crea-0.94 on 10/18/23, Diagnosis-Afib , and last seen by Rockie Hummer on 10/03/23. Dose is appropriate based on dosing criteria. Will send in refill to requested pharmacy.    Per last OV note on 10/03/23 it states:  Continue Eliquis  2.5mg  BID x 6 months (02/29/24) and then convert to a baby aspirin 81mg .

## 2024-02-20 ENCOUNTER — Ambulatory Visit: Attending: Internal Medicine | Admitting: Internal Medicine

## 2024-02-20 VITALS — BP 130/78 | HR 76 | Ht 66.0 in | Wt 188.0 lb

## 2024-02-20 DIAGNOSIS — I10 Essential (primary) hypertension: Secondary | ICD-10-CM | POA: Diagnosis not present

## 2024-02-20 DIAGNOSIS — Z95818 Presence of other cardiac implants and grafts: Secondary | ICD-10-CM | POA: Diagnosis not present

## 2024-02-20 DIAGNOSIS — I480 Paroxysmal atrial fibrillation: Secondary | ICD-10-CM | POA: Diagnosis not present

## 2024-02-20 MED ORDER — ASPIRIN 81 MG PO TBEC: 81.0000 mg | DELAYED_RELEASE_TABLET | Freq: Every day | ORAL | 3 refills | Status: AC

## 2024-02-20 NOTE — Progress Notes (Signed)
 Cardiology Office Note:    Date:  02/20/2024   ID:  [name], DOB 10/27/1945, MRN [id number]  PCP:  Royden Ronal Czar, FNP   [facility] Providers Cardiologist:  Stanly DELENA Leavens, MD Electrophysiologist:  OLE ONEIDA HOLTS, MD     Referring MD: Royden Ronal Czar, FNP   CC: Follow up Watchman  History of Present Illness:    [name] is a 78 y.o. female with a hx of PAF, HTN, and HLD. 2023: Second episode of PAF (self-converted)  Start DOAC and was doing fine. Had PRN diltiazem  and did not need it.  Low normal LVEF, 50% by my estimate.  Started on BB. 2024: High CAC burden; delayed LAA-O.  Negative stress test.  Scheduled for 06/16/23. 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  [name] is a 78 year old female with paroxysmal atrial fibrillation who presents for follow-up regarding her anticoagulation therapy.  She has a history of paroxysmal atrial fibrillation and was previously started on a beta blocker due to a low normal ejection fraction. She underwent a Watchman device placement in 2024 and is currently doing well post-procedure. She is eager to discontinue Eliquis , which she has been on for six months. She has prepared her medication organizer with Eliquis  only for today, as she intends to stop the medication nine days early.  Her past medical history includes hypertension and hyperlipidemia. She is on hydrochlorothiazide and losartan, which have effectively controlled her blood pressure. Her LDL cholesterol is slightly elevated at 87 mg/dL, and she is currently on atorvastatin 40 mg. She also takes aspirin  and Lipitor.  She has a high calcium score but a low-risk PET scan. There is a family history of cardiovascular events, with her father having had a massive heart attack and her mother a massive stroke, both in their mid-fifties.  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 However, she has had to cancel some activities due to high temperatures. She is looking forward to cooler weather to maintain her activity levels.  No chest pain, chest pressure, chest tightness, or chest singing. Reports residual bruising.   Past Medical History:  Diagnosis Date   Cataract    bilateral -removed   History of atrial fibrillation    15-20 years ago   Hyperlipidemia    Hypertension    Presence of Watchman left atrial appendage closure device 09/01/2023   24mm Watchman FLX Pro placed by Dr. HOLTS   Skin cancer    melanoma and basal cell and squamous cell   Thyroid  disease    doesnt have thyroid     Past Surgical History:  Procedure Laterality Date   ABDOMINAL HYSTERECTOMY     has ovaries   CATARACT EXTRACTION, BILATERAL     COLONOSCOPY     DILATION AND CURETTAGE OF UTERUS     FOOT MASS EXCISION     LEFT ATRIAL APPENDAGE OCCLUSION N/A 09/01/2023   Procedure: LEFT ATRIAL APPENDAGE OCCLUSION;  Surgeon: HOLTS OLE ONEIDA, MD;  Location: [facility] INVASIVE CV LAB;  Service: Cardiovascular;  Laterality: N/A;   MELANOMA EXCISION     POLYPECTOMY     TRANSESOPHAGEAL ECHOCARDIOGRAM (CATH LAB) N/A 09/01/2023   Procedure: TRANSESOPHAGEAL ECHOCARDIOGRAM;  Surgeon: HOLTS OLE ONEIDA, MD;  Location: [facility] INVASIVE CV LAB;  Service: Cardiovascular;  Laterality: N/A;    Current Medications: Current Meds  Medication Sig  aspirin  EC 81 MG tablet Take 1 tablet (81 mg total) by mouth daily. Swallow whole.   atorvastatin (LIPITOR) 40 MG tablet Take 20 mg by mouth daily.   Calcium Carbonate Antacid 400 MG CHEW Chew 2 tablets by mouth daily. Ultra Chewable 400 mg.  Takes 2 daily.   cholecalciferol (VITAMIN D) 1000 units tablet Take 1,000 Units by mouth daily.   Coenzyme Q10 200 MG capsule Take 200 mg by mouth daily.   diltiazem  (CARDIZEM ) 30 MG tablet Take 1 tablet (30 mg total) by mouth every 6 (six) hours as needed (palpitations).    hydrochlorothiazide (HYDRODIURIL) 12.5 MG tablet Take 12.5 mg by mouth daily.   losartan (COZAAR) 50 MG tablet Take 25 mg by mouth daily.   metoprolol  succinate (TOPROL -XL) 25 MG 24 hr tablet TAKE 1 TABLET BY MOUTH DAILY.   Multiple Vitamin (MULTIVITAMIN) tablet Take 1 tablet by mouth daily. Nature Adult   SYNTHROID 88 MCG tablet Take 88 mcg by mouth every morning.   [DISCONTINUED] apixaban  (ELIQUIS ) 2.5 MG TABS tablet Take 1 tablet (2.5 mg total) by mouth 2 (two) times daily.     Allergies:   Lincomycin hcl and Chloramphenicol   Social History   Socioeconomic History   Marital status: Married    Spouse name: Not on file   Number of children: Not on file   Years of education: Not on file   Highest education level: Not on file  Occupational History   Not on file  Tobacco Use   Smoking status: Former   Smokeless tobacco: Never   Tobacco comments:    quit 20 yrs ago  Vaping Use   Vaping status: Never Used  Substance and Sexual Activity   Alcohol use: Yes    Alcohol/week: 0.0 standard drinks of alcohol    Comment: once monthly MAYBE   Drug use: No   Sexual activity: Not on file  Other Topics Concern   Not on file  Social History Narrative   Not on file   Social Drivers of Health   Financial Resource Strain: Not on file  Food Insecurity: No Food Insecurity (09/01/2023)   Hunger Vital Sign    Worried About Running Out of Food in the Last Year: Never true    Ran Out of Food in the Last Year: Never true  Transportation Needs: No Transportation Needs (09/01/2023)   PRAPARE - Administrator, Civil Service (Medical): No    Lack of Transportation (Non-Medical): No  Physical Activity: Not on file  Stress: Not on file  Social Connections: Socially Isolated (09/01/2023)   Social Connection and Isolation Panel    Frequency of Communication with Friends and Family: Never    Frequency of Social Gatherings with Friends and Family: Never    Attends Religious Services: Never     Database administrator or Organizations: No    Attends Engineer, structural: Never    Marital Status: Married   Social: former Nurse, learning disability, married; husband sees Dr. Barbaraann and [place]  Family History: The patient's family history includes Colon cancer in her paternal grandfather. There is no history of Esophageal cancer, Rectal cancer, or Stomach cancer. Family history of AAA.  ROS:   See HPI.  EKGs/Labs/Other Studies Reviewed:    The following studies were reviewed today:  EKG:    06/01/22: sinus rhythm 05/31/22: A fib RVR anterior infarct pattern Rate 158  Cardiac Studies & Procedures   ______________________________________________________________________________________________   STRESS TESTS  NM PET  CT CARDIAC PERFUSION MULTI W/ABSOLUTE BLOODFLOW 05/24/2023  Narrative   The study is normal. The study is low risk.   LV perfusion is normal. There is no evidence of ischemia.   Rest left ventricular function is normal. Rest EF: 60%. Stress left ventricular function is normal. Stress EF: 66%. End diastolic cavity size is normal. End systolic cavity size is normal.   Myocardial blood flow was computed to be 1.72ml/g/min at rest and 3.52ml/g/min at stress. Global myocardial blood flow reserve was 2.65 and was normal.   Coronary calcium was present on the attenuation correction CT images. Severe coronary calcifications were present. Coronary calcifications were present in the left anterior descending artery, left circumflex artery and right coronary artery distribution(s).   Electronically signed by Jerel Balding, MD  CLINICAL DATA:  This over-read does not include interpretation of cardiac or coronary anatomy or pathology. The cardiac PET-CT interpretation by the cardiologist is attached.  COMPARISON:  None Available.  FINDINGS: No suspicious nodules, masses, or infiltrates are identified in the visualized portion of the lungs. No pleural fluid seen.  The  visualized portions of the mediastinum and chest wall are unremarkable.  IMPRESSION: No significant non-cardiac abnormality identified.   Electronically Signed By: Norleen DELENA Kil M.D. On: 05/24/2023 13:27   ECHOCARDIOGRAM  ECHOCARDIOGRAM COMPLETE 06/15/2022  Narrative ECHOCARDIOGRAM REPORT    Patient Name:   KYMIAH ARAIZA Date of Exam: 06/15/2022 Medical Rec #:  [id number]     Height:       66.0 in Accession #:    [id number]    Weight:       188.0 lb Date of Birth:  May 01, 1946     BSA:          1.948 m Patient Age:    76 years      BP:           134/66 mmHg Patient Gender: F             HR:           79 bpm. Exam Location:  [address]  Procedure: 2D Echo, 3D Echo, Cardiac Doppler, Color Doppler and Strain Analysis  Indications:    I48.91 Atrial Fibrillation  History:        Patient has no prior history of Echocardiogram examinations. Arrythmias:Atrial Fibrillation; Risk Factors:Hypertension, Dyslipidemia and Former Smoker.  Sonographer:    Heather Hawks RDCS Referring Phys: [facility] A Keshawn Sundberg  IMPRESSIONS   1. Left ventricular ejection fraction, by estimation, is 45 to 50%. The left ventricle has mildly decreased function. The left ventricle demonstrates global hypokinesis. Left ventricular diastolic parameters are consistent with Grade I diastolic dysfunction (impaired relaxation). The average left ventricular global longitudinal strain is -18.3 %. The global longitudinal strain is abnormal. 2. Right ventricular systolic function is normal. The right ventricular size is normal. 3. The mitral valve is normal in structure. Mild mitral valve regurgitation. No evidence of mitral stenosis. 4. The aortic valve is tricuspid. Aortic valve regurgitation is not visualized. Aortic valve sclerosis is present, with no evidence of aortic valve stenosis. 5. The inferior vena cava is normal in size with greater than 50% respiratory variability, suggesting right atrial pressure  of 3 mmHg.  FINDINGS Left Ventricle: Left ventricular ejection fraction, by estimation, is 45 to 50%. The left ventricle has mildly decreased function. The left ventricle demonstrates global hypokinesis. The average left ventricular global longitudinal strain is -18.3 %. The global longitudinal strain is abnormal. The left ventricular internal cavity size was  normal in size. There is no left ventricular hypertrophy. Left ventricular diastolic parameters are consistent with Grade I diastolic dysfunction (impaired relaxation).  Right Ventricle: The right ventricular size is normal. Right ventricular systolic function is normal.  Left Atrium: Left atrial size was normal in size.  Right Atrium: Right atrial size was normal in size.  Pericardium: There is no evidence of pericardial effusion.  Mitral Valve: The mitral valve is normal in structure. Mild mitral annular calcification. Mild mitral valve regurgitation. No evidence of mitral valve stenosis.  Tricuspid Valve: The tricuspid valve is normal in structure. Tricuspid valve regurgitation is trivial. No evidence of tricuspid stenosis.  Aortic Valve: The aortic valve is tricuspid. Aortic valve regurgitation is not visualized. Aortic valve sclerosis is present, with no evidence of aortic valve stenosis.  Pulmonic Valve: The pulmonic valve was normal in structure. Pulmonic valve regurgitation is trivial. No evidence of pulmonic stenosis.  Aorta: The aortic root is normal in size and structure.  Venous: The inferior vena cava is normal in size with greater than 50% respiratory variability, suggesting right atrial pressure of 3 mmHg.  IAS/Shunts: No atrial level shunt detected by color flow Doppler.   LEFT VENTRICLE PLAX 2D LVIDd:         3.70 cm   Diastology LVIDs:         2.70 cm   LV e' medial:    8.05 cm/s LV PW:         0.90 cm   LV E/e' medial:  7.8 LV IVS:        1.10 cm   LV e' lateral:   10.60 cm/s LVOT diam:     2.10 cm   LV  E/e' lateral: 5.9 LV SV:         56 LV SV Index:   29        2D Longitudinal Strain LVOT Area:     3.46 cm  2D Strain GLS (A2C):   -16.4 % 2D Strain GLS (A3C):   -17.7 % 2D Strain GLS (A4C):   -20.7 % 2D Strain GLS Avg:     -18.3 %  3D Volume EF: 3D EF:        52 % LV EDV:       148 ml LV ESV:       71 ml LV SV:        77 ml  RIGHT VENTRICLE RV Basal diam:  3.70 cm RV S prime:     11.60 cm/s TAPSE (M-mode): 1.8 cm  LEFT ATRIUM             Index        RIGHT ATRIUM           Index LA diam:        3.30 cm 1.69 cm/m   RA Area:     17.70 cm LA Vol (A2C):   55.6 ml 28.54 ml/m  RA Volume:   49.50 ml  25.41 ml/m LA Vol (A4C):   70.2 ml 36.04 ml/m LA Biplane Vol: 63.5 ml 32.60 ml/m AORTIC VALVE LVOT Vmax:   82.00 cm/s LVOT Vmean:  54.750 cm/s LVOT VTI:    0.163 m  AORTA Ao Root diam: 2.90 cm Ao Asc diam:  3.00 cm  MITRAL VALVE MV Area (PHT)  cm         SHUNTS MV Decel Time: 213 msec    Systemic VTI:  0.16 m MR Peak grad: 126.3 mmHg   Systemic Diam:  2.10 cm MR Mean grad: 84.0 mmHg MR Vmax:      562.00 cm/s MR Vmean:     434.0 cm/s MV E velocity: 62.60 cm/s MV A velocity: 97.75 cm/s MV E/A ratio:  0.64  Redell Shallow MD Electronically signed by Redell Shallow MD Signature Date/Time: 06/15/2022/4:04:48 PM    Final   TEE  ECHO TEE 09/01/2023  Narrative TRANSESOPHOGEAL ECHO REPORT    Patient Name:   TENAYA HILYER Date of Exam: 09/01/2023 Medical Rec #:  [id number]     Height:       66.0 in Accession #:    [id number]    Weight:       188.0 lb Date of Birth:  09/20/1945     BSA:          1.948 m Patient Age:    77 years      BP:           149/82 mmHg Patient Gender: F             HR:           80 bpm. Exam Location:  Inpatient  Procedure: 3D Echo, Transesophageal Echo, Cardiac Doppler and Color Doppler  Indications:     I48.91* Unspeicified atrial fibrillation  History:         Patient has prior history of Echocardiogram examinations, most recent  06/15/2022. Abnormal ECG, Arrythmias:Atrial Fibrillation; Risk Factors:Hypertension and Dyslipidemia.  Sonographer:     Ellouise Mose RDCS Referring Phys:  [id number] OLE ONEIDA HOLTS Diagnosing Phys: Maude Emmer MD   Sonographer Comments: Watchman procedure using 24mm FLX device.   PROCEDURE: After discussion of the risks and benefits of a TEE, an informed consent was obtained from the patient. The transesophogeal probe was passed without difficulty through the esophogus of the patient. Imaged were obtained with the patient in a supine position. Sedation performed by different physician. The patient was monitored while under deep sedation. Anesthestetic sedation was provided intravenously by Anesthesiology: 790mg  of Propofol , 60mg  of Lidocaine . The patient's vital signs; including heart rate, blood pressure, and oxygen saturation; remained stable throughout the procedure. The patient developed no complications during the procedure. Watchman procedure. 24mm FLX device.  IMPRESSIONS   1. Chicken wing appendage with no thrombus. Post procedure well positioned 24 mm WAtchman FLX device with no lead by color flow and negative tugg test. Average compression 18%. 2. Left ventricular ejection fraction, by estimation, is 60 to 65%. The left ventricle has normal function. The left ventricle has no regional wall motion abnormalities. 3. Right ventricular systolic function is normal. The right ventricular size is normal. 4. Small PFO prior to trans septal puncture Post procedure only left to right shunting seen . No left atrial/left atrial appendage thrombus was detected. 5. The mitral valve is normal in structure. Mild mitral valve regurgitation. No evidence of mitral stenosis. 6. The aortic valve is tricuspid. There is mild calcification of the aortic valve. There is mild thickening of the aortic valve. Aortic valve regurgitation is not visualized. Aortic valve sclerosis is present, with no evidence of  aortic valve stenosis. 7. The inferior vena cava is normal in size with greater than 50% respiratory variability, suggesting right atrial pressure of 3 mmHg. 8. Evidence of atrial level shunting detected by color flow Doppler. 9. Watchman procedure. 24mm FLX device.  Conclusion(s)/Recommendation(s): Normal biventricular function without evidence of hemodynamically significant valvular heart disease.  FINDINGS Left Ventricle: Left ventricular ejection fraction, by estimation, is 60 to 65%. The  left ventricle has normal function. The left ventricle has no regional wall motion abnormalities. The left ventricular internal cavity size was normal in size. There is no left ventricular hypertrophy.  Right Ventricle: The right ventricular size is normal. No increase in right ventricular wall thickness. Right ventricular systolic function is normal.  Left Atrium: Small PFO prior to trans septal puncture Post procedure only left to right shunting seen. Left atrial size was normal in size. No left atrial/left atrial appendage thrombus was detected.  Right Atrium: Right atrial size was normal in size.  Pericardium: There is no evidence of pericardial effusion.  Mitral Valve: The mitral valve is normal in structure. Mild mitral valve regurgitation. No evidence of mitral valve stenosis.  Tricuspid Valve: The tricuspid valve is normal in structure. Tricuspid valve regurgitation is trivial. No evidence of tricuspid stenosis.  Aortic Valve: The aortic valve is tricuspid. There is mild calcification of the aortic valve. There is mild thickening of the aortic valve. Aortic valve regurgitation is not visualized. Aortic valve sclerosis is present, with no evidence of aortic valve stenosis.  Pulmonic Valve: The pulmonic valve was normal in structure. Pulmonic valve regurgitation is not visualized. No evidence of pulmonic stenosis.  Aorta: The aortic root is normal in size and structure.  Venous: The inferior  vena cava is normal in size with greater than 50% respiratory variability, suggesting right atrial pressure of 3 mmHg.  IAS/Shunts: Evidence of atrial level shunting detected by color flow Doppler.  Additional Comments: Chicken wing appendage with no thrombus. Post procedure well positioned 24 mm WAtchman FLX device with no lead by color flow and negative tugg test. Average compression 18%. Spectral Doppler performed.  Maude Emmer MD Electronically signed by Maude Emmer MD Signature Date/Time: 09/01/2023/10:00:13 AM    Final        ______________________________________________________________________________________________         Physical Exam:    VS:  BP 130/78 (BP Location: Left Arm)   Pulse 76   Ht 5' 6 (1.676 m)   Wt 188 lb (85.3 kg)   SpO2 96%   BMI 30.34 kg/m     Wt Readings from Last 3 Encounters:  02/20/24 188 lb (85.3 kg)  10/03/23 186 lb 9.6 oz (84.6 kg)  09/01/23 188 lb (85.3 kg)    GEN:  Well nourished, well developed in no acute distress HEENT: Normal CARDIAC: RRR, no murmurs, rubs, gallops RESPIRATORY:  Clear to auscultation without rales, wheezing or rhonchi  ABDOMEN: Soft, non-tender, non-distended MUSCULOSKELETAL:  No edema; No deformity  SKIN: Warm and dry NEUROLOGIC:  Alert and oriented x 3 PSYCHIATRIC:  Normal affect   ASSESSMENT/PLAN:     Paroxysmal atrial fibrillation Paroxysmal atrial fibrillation managed with a Watchman device, which is completely endothelialized with no leaks or thrombus. CT scan supports stopping Eliquis  early due to successful endothelialization.  I reviewed her images with her and her partner. - Send courtesy message to Dr. Cindie; we reviewed the literature on what could happened if she stopped AC 9 days earlier than planned in the setting of no leak and no DRT; she is having residual bruising and will stop  Coronary artery calcification Coronary artery calcification with a high calcium score but low-risk stress  test. No symptoms of chest pain or pressure. Emphasis on aggressive cholesterol management due to family history of cardiovascular events. - Monitor for symptoms of chest pain or pressure - Discuss increasing atorvastatin if cholesterol remains elevated  Hyperlipidemia LDL cholesterol slightly elevated at 87 mg/dL.  Goal to reduce LDL to under 70 mg/dL due to coronary artery calcification. Potential increase in atorvastatin from 40 mg to 80 mg if LDL remains elevated by next visit. Importance of lifestyle modifications emphasized. - Consider increasing atorvastatin to 80 mg if LDL remains elevated by next visit - Encourage lifestyle modifications to reduce LDL  Hypertension Hypertension well controlled with current therapy  Annual visit me or my team  Stanly Leavens, MD FASE [facility] Cardiologist [facility]  [address] [place], [place] [address] [phone]  12:47 PM

## 2024-02-20 NOTE — Patient Instructions (Signed)
 Medication Instructions:  Your physician has recommended you make the following change in your medication:  STOP: apixaban  START: aspirin  81 mg by mouth once daily  *If you need a refill on your cardiac medications before your next appointment, please call your pharmacy*  Lab Work: NONE  If you have labs (blood work) drawn today and your tests are completely normal, you will receive your results only by: MyChart Message (if you have MyChart) OR A paper copy in the mail If you have any lab test that is abnormal or we need to change your treatment, we will call you to review the results.  Testing/Procedures: NONE  Follow-Up: At [facility], you and your health needs are our priority.  As part of our continuing mission to provide you with exceptional heart care, our providers are all part of one team.  This team includes your primary Cardiologist (physician) and Advanced Practice Providers or APPs (Physician Assistants and Nurse Practitioners) who all work together to provide you with the care you need, when you need it.  Your next appointment:   1 year(s)  Provider:   Stanly DELENA Leavens, MD

## 2024-05-01 ENCOUNTER — Other Ambulatory Visit: Payer: Self-pay | Admitting: Registered Nurse

## 2024-05-01 DIAGNOSIS — Z1231 Encounter for screening mammogram for malignant neoplasm of breast: Secondary | ICD-10-CM

## 2024-06-01 ENCOUNTER — Other Ambulatory Visit: Payer: Self-pay | Admitting: Internal Medicine

## 2024-06-11 ENCOUNTER — Ambulatory Visit
Admission: RE | Admit: 2024-06-11 | Discharge: 2024-06-11 | Disposition: A | Discharge status: Home / Self Care | Source: Ambulatory Visit | Attending: Registered Nurse | Admitting: Registered Nurse

## 2024-06-11 DIAGNOSIS — Z1231 Encounter for screening mammogram for malignant neoplasm of breast: Secondary | ICD-10-CM | POA: Diagnosis not present

## 2024-07-05 ENCOUNTER — Telehealth: Payer: Self-pay

## 2024-07-05 NOTE — Telephone Encounter (Signed)
 Tried to call patient to see how she is doing almost 1 year s/p LAAO implant (09/01/2023). Patient not home. Will try again later.

## 2024-07-06 NOTE — Telephone Encounter (Signed)
 Spoke with patient's husband. He reports is doing well 1 year post implant with no concerns. He states he lives with her and helps her in the morning and evening.   Patient did not wish to speak on the phone but was heard in the background.
